# Patient Record
Sex: Female | Born: 1985
Health system: Southern US, Community
[De-identification: ages and names within clinical notes are randomized; demographics above are authoritative.]

## PROBLEM LIST (undated history)

## (undated) ENCOUNTER — Inpatient Hospital Stay (HOSPITAL_COMMUNITY): Payer: Self-pay

## (undated) DIAGNOSIS — D649 Anemia, unspecified: Secondary | ICD-10-CM

## (undated) DIAGNOSIS — J45909 Unspecified asthma, uncomplicated: Secondary | ICD-10-CM

## (undated) HISTORY — PX: NO PAST SURGERIES: SHX2092

---
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
## 2007-07-19 ENCOUNTER — Encounter (INDEPENDENT_AMBULATORY_CARE_PROVIDER_SITE_OTHER): Payer: Self-pay | Admitting: Family Medicine

## 2007-07-29 ENCOUNTER — Encounter (INDEPENDENT_AMBULATORY_CARE_PROVIDER_SITE_OTHER): Payer: Self-pay | Admitting: Family Medicine

## 2007-07-29 ENCOUNTER — Ambulatory Visit: Payer: Self-pay | Admitting: Family Medicine

## 2007-07-29 DIAGNOSIS — N920 Excessive and frequent menstruation with regular cycle: Secondary | ICD-10-CM | POA: Insufficient documentation

## 2007-07-29 LAB — CONVERTED CEMR LAB
ALT: 26 units/L (ref 0–35)
AST: 20 units/L (ref 0–37)
Albumin: 4.2 g/dL (ref 3.5–5.2)
Alkaline Phosphatase: 43 units/L (ref 39–117)
BUN: 13 mg/dL (ref 6–23)
Beta hcg, urine, semiquantitative: NEGATIVE
CO2: 23 meq/L (ref 19–32)
Calcium: 9 mg/dL (ref 8.4–10.5)
Chloride: 106 meq/L (ref 96–112)
Cholesterol: 175 mg/dL (ref 0–200)
Creatinine, Ser: 0.61 mg/dL (ref 0.40–1.20)
Glucose, Bld: 89 mg/dL (ref 70–99)
HCT: 36.6 % (ref 36.0–46.0)
HDL: 57 mg/dL (ref >39)
Hemoglobin: 11.9 g/dL — ABNORMAL LOW (ref 12.0–15.0)
LDL Cholesterol: 105 mg/dL — ABNORMAL HIGH (ref 0–99)
MCHC: 32.5 g/dL (ref 30.0–36.0)
MCV: 91.3 fL (ref 78.0–100.0)
Platelets: 211 10*3/uL (ref 150–400)
Potassium: 4.2 meq/L (ref 3.5–5.3)
RBC: 4.01 M/uL (ref 3.87–5.11)
RDW: 13.3 % (ref 11.5–15.5)
Sodium: 138 meq/L (ref 135–145)
Total Bilirubin: 0.5 mg/dL (ref 0.3–1.2)
Total CHOL/HDL Ratio: 3.1
Total Protein: 7 g/dL (ref 6.0–8.3)
Triglycerides: 67 mg/dL (ref <150)
VLDL: 13 mg/dL (ref 0–40)
WBC: 5.5 10*3/uL (ref 4.0–10.5)

## 2007-09-17 ENCOUNTER — Encounter: Payer: Self-pay | Admitting: *Deleted

## 2007-09-18 ENCOUNTER — Encounter: Payer: Self-pay | Admitting: Family Medicine

## 2007-09-19 ENCOUNTER — Ambulatory Visit: Payer: Self-pay | Admitting: Family Medicine

## 2008-01-30 ENCOUNTER — Ambulatory Visit: Payer: Self-pay | Admitting: Family Medicine

## 2008-09-28 ENCOUNTER — Ambulatory Visit: Payer: Self-pay | Admitting: Family Medicine

## 2008-09-28 ENCOUNTER — Encounter: Payer: Self-pay | Admitting: Family Medicine

## 2008-09-28 DIAGNOSIS — Z9189 Other specified personal risk factors, not elsewhere classified: Secondary | ICD-10-CM | POA: Insufficient documentation

## 2008-09-30 ENCOUNTER — Ambulatory Visit: Payer: Self-pay | Admitting: Family Medicine

## 2009-03-24 ENCOUNTER — Encounter: Payer: Self-pay | Admitting: Family Medicine

## 2009-03-24 ENCOUNTER — Ambulatory Visit: Payer: Self-pay | Admitting: Family Medicine

## 2009-03-24 ENCOUNTER — Telehealth: Payer: Self-pay | Admitting: Family Medicine

## 2009-03-24 DIAGNOSIS — R3589 Other polyuria: Secondary | ICD-10-CM | POA: Insufficient documentation

## 2009-03-24 DIAGNOSIS — R110 Nausea: Secondary | ICD-10-CM | POA: Insufficient documentation

## 2009-03-24 DIAGNOSIS — R358 Other polyuria: Secondary | ICD-10-CM | POA: Insufficient documentation

## 2009-03-24 DIAGNOSIS — B373 Candidiasis of vulva and vagina: Secondary | ICD-10-CM | POA: Insufficient documentation

## 2009-03-24 LAB — CONVERTED CEMR LAB
Beta hcg, urine, semiquantitative: NEGATIVE
Bilirubin Urine: NEGATIVE
Chlamydia, DNA Probe: NEGATIVE
GC Probe Amp, Genital: NEGATIVE
Glucose, Urine, Semiquant: NEGATIVE
Ketones, urine, test strip: NEGATIVE
Nitrite: NEGATIVE
Pap Smear: NEGATIVE
Protein, U semiquant: NEGATIVE
Specific Gravity, Urine: >=1.03
Urobilinogen, UA: 0.2
WBC Urine, dipstick: NEGATIVE
pH: 6

## 2009-06-02 ENCOUNTER — Telehealth: Payer: Self-pay | Admitting: *Deleted

## 2009-10-27 ENCOUNTER — Encounter: Payer: Self-pay | Admitting: Family Medicine

## 2010-02-08 NOTE — Miscellaneous (Signed)
Summary: ROI  ROI   Imported By: Bradly Bienenstock 07/19/2007 12:58:33  _____________________________________________________________________  External Attachment:    Type:   Image     Comment:   External Document

## 2010-02-08 NOTE — Progress Notes (Signed)
Summary: [place] request  Phone Note Call from Patient Call back at [phone]   Summary of Call: pt is wanting to be seen tomorrow for a cold Initial call taken by: Haydee Salter,  April 12, 2006 10:52 AM  Follow-up for Phone Call        c/o head cold since monday. explained viral illnesses & otc used to treat symptoms.  Needs refill on ocp. she will call pharmacy Follow-up by: Golden Circle RN,  April 12, 2006 11:04 AM

## 2010-02-08 NOTE — Assessment & Plan Note (Signed)
Summary: vision & hearing.form in triage  Nurse Visit   Vital Signs:  Patient Profile:   25 Years Old Female              Vision Screening: Left eye w/o correction: 20 / 160 Right Eye w/o correction: 20 / 160 Left eye with correction: 20 / 25 Right eye with correction: 20 / 30        Vision Entered By: Jone Baseman CMA (September 19, 2007 11:17 AM) Audiometry Screening     20db HL: Yes      Hearing Testing Entered By: Jone Baseman CMA (September 19, 2007 11:17 AM)   Prior Medications: PROAIR HFA 108 (90 BASE) MCG/ACT  AERS (ALBUTEROL SULFATE) 2 puffs inhaled every 4 hours as needed - dispense 1 device BENZACLIN WITH PUMP 1-5 % GEL (CLINDAMYCIN PHOS-BENZOYL PEROX) Apply as directed to skin twice a day FLOVENT HFA 44 MCG/ACT AERO (FLUTICASONE PROPIONATE  HFA) Inhale 2 puff twice a day ORTHO TRI-CYCLEN LO 0.025 MG TABS (NORGESTIMATE-ETHINYL ESTRADIOL) Take 1 tablet by mouth once a day PEAK FLOW METER   DEVI (PEAK FLOW METER) Use as directed - dispense 1 device TRETINOIN 0.025 %  CREA (TRETINOIN) Apply to face at bedtime - wait 30 min after washing - dispense 1 tube Current Allergies: No known allergies     Orders Added: 1)  Hearing- [facility] [92551] 2)  Vision- [facility] [[id number]] 3)  No Charge Patient Arrived (NCPA0) [NCPA0]    ]  Vital Signs:  Patient Profile:   25 Years Old Female              Vision Screening: Left eye w/o correction: 20 / 160 Right Eye w/o correction: 20 / 160 Left eye with correction: 20 / 25 Right eye with correction: 20 / 30

## 2010-02-08 NOTE — Assessment & Plan Note (Signed)
Summary: CPE/KH   Vital Signs:  Patient Profile:   25 Years Old Female Height:     65.5 inches Weight:      147.5 pounds Temp:     98.2 degrees F Pulse rate:   75 / minute BP sitting:   109 / 68  (left arm)  Pt. in pain?   no  Vitals Entered By: Alphia Kava (July 29, 2007 9:40 AM)              Is Patient Diabetic? No     PCP:  Drue Dun MD  Chief Complaint:  Asthma and Acne.  History of Present Illness: 25 yo AAF who presents for physical and to discuss Asthma, Acne.  1.  CPE - Currently on menstrual cycle.  Has not yet had pap as not sexually active until 25 yo.  Needs pap by next January.  Continues on OCP's for contraception without problems - requests refill.    2.  Asthma - Reports increased symptoms; however, not using Flovent regularly.  Also reports that Albuterol inhaler is not working as well as it used to since switching to Proventil.  Requests alternative brand.  3.  Acne - Mild comedonal acne.  Benzaclin not adequately controlling.  Requests alternative.      Prior Medication List:  PROVENTIL HFA 108 (90 BASE) MCG/ACT  AERS (ALBUTEROL SULFATE) 2 puffs inhaled q4 hrs as needed for shortness of breath or wheezing BENZACLIN WITH PUMP 1-5 % GEL (CLINDAMYCIN PHOS-BENZOYL PEROX) Apply as directed to skin twice a day FLOVENT HFA 44 MCG/ACT AERO (FLUTICASONE PROPIONATE  HFA) Inhale 2 puff twice a day ORTHO TRI-CYCLEN LO 0.025 MG TABS (NORGESTIMATE-ETHINYL ESTRADIOL) Take 1 tablet by mouth once a day   Current Allergies: No known allergies   Past Medical History:    Reviewed history from 06/18/2006 and no changes required:       s/p referral to Neuro for vertigo/dizziness       Asthma  Past Surgical History:    Reviewed history from 06/18/2006 and no changes required:       None   Family History:    Reviewed history from 06/18/2006 and no changes required:       brother - asthma       mother - asthma       sister - asthma       sister -  eczema  Social History:    Reviewed history from 06/18/2006 and no changes required:       Lives w/ mother, 2 sisters Barkley Bruns and [place]), and brother Jari Favre during summer.  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  No tob in home.  No etoh or drugs.  First sexually active at 25 yo - needs first pap at 25 yo.    Review of Systems      See HPI   Physical Exam  General:     Well-developed, well-nourished, no acute distress.  Alert and oriented x 3.  Head:     Normocephalic, atraumatic, no abnormalities noted.  Eyes:     Pupils equal round and reactive to light, conjunctivae and lids clear.  No scleral icterus.  Ears:     No external deformities.  B TM pearly gray with cone.    Nose:     No external deformities.  Nares without discharge.  Mouth:     Moist mucous membranes.  No erythema or exudate.  Neck:  Supple, non-tender, no thyromegaly, no anterior or posterior cervical, submandibular, occipital, or supraclavicular lymphadenopathy.   Breasts:     No axillary lymphadenopathy.  Skin/areolae normal.  No nipple discharge or dimpling.  Breast tissue symmetrical without masses or tenderness.  Lungs:     Clear to auscultation bilaterally.  Normal work of breathing.  No wheezes, rales, or rhonchi.  Heart:     Regular rate and rhythm.  No murmur, rub, or gallop.  2+ Dorsalis Pedis pulses.  Abdomen:     Normoactive bowel sounds.  Soft, non-tender, non-distended.  Msk:     No deformity or scoliosis noted of thoracic or lumbar spine.   Extremities:     No clubbing, cyanosis, or edema.  Neurologic:     alert & oriented X3, cranial nerves II-XII intact, strength normal in all extremities, sensation intact to light touch, gait normal, and DTRs symmetrical and normal.   Skin:     Intact without suspicious lesions or rashes Psych:     Cognition and judgment appear intact. Alert and cooperative with normal attention span  and concentration. No apparent delusions, illusions, hallucinations    Impression & Recommendations:  Problem # 1:  HEALTH MAINTENANCE EXAM (ICD-V70.0) Assessment: Unchanged Doing well.  Needs pap by 1/09.  Patient to schedule when not on menses.  Will do STD testing at that time. Orders: Comp Met-[facility] [id number]) Lipid-[facility] ([id number]) [facility] - Est  18-39 yrs (84132)   Problem # 2:  ASTHMA, UNSPECIFIED (ICD-493.90) Assessment: Deteriorated Increased symptoms per patient;however, not using Flovent regularly.  Reminded of importance as a maintenance medication even when not having symptoms.  Will change Albuterol HFA to Proair from Proventil.  Also instructed on peak flows.  Prescribed peak flow meter.  Advised to record Peak flows when feeling like symptoms are worse. Her updated medication list for this problem includes:    Proair Hfa 108 (90 Base) Mcg/act Aers (Albuterol sulfate) .Marland Kitchen... 2 puffs inhaled every 4 hours as needed - dispense 1 device    Flovent Hfa 44 Mcg/act Aero (Fluticasone propionate  hfa) ..... Inhale 2 puff twice a day Orders: [facility] - Est  18-39 yrs ([id number])   Problem # 3:  ACNE (ICD-706.1) Assessment: Unchanged Persists on Benzaclin, thus will add Tretinoin Cream at lowest strength.  Advised that this medicine should not be used if pregnant, thus needs to remain on birth control while using.  May need higher strength, but will start with lowest to avoid irritation.  Instructed on use prior to bed 30 min after washing face to help reduce irritation - may use every other day at first as well. Her updated medication list for this problem includes:    Benzaclin With Pump 1-5 % Gel (Clindamycin phos-benzoyl perox) .Marland Kitchen... Apply as directed to skin twice a day    Tretinoin 0.025 % Crea (Tretinoin) .Marland Kitchen... Apply to face at bedtime - wait 30 min after washing - dispense 1 tube rders: [facility] - Est  18-39 yrs ([id number])   Complete Medication List: 1)  Proair Hfa 108 (90 Base) Mcg/act  Aers (Albuterol sulfate) .... 2 puffs inhaled every 4 hours as needed - dispense 1 device 2)  Benzaclin With Pump 1-5 % Gel (Clindamycin phos-benzoyl perox) .... Apply as directed to skin twice a day 3)  Flovent Hfa 44 Mcg/act Aero (Fluticasone propionate  hfa) .... Inhale 2 puff twice a day 4)  Ortho Tri-cyclen Lo 0.025 Mg Tabs (Norgestimate-ethinyl estradiol) .... Take 1 tablet by mouth once a day 5)  Peak Flow Meter Devi (Peak flow meter) .... Use as directed - dispense 1 device 6)  Tretinoin 0.025 % Crea (Tretinoin) .... Apply to face at bedtime - wait 30 min after washing - dispense 1 tube  Other Orders: U Preg-[facility] ([id number]) CBC-[facility] (82956)   Patient Instructions: 1)  Please schedule a follow-up appointment for your Pap smear sometime between now and January. 2)  Use your Flovent 2 puffs two times a day consistently - if your asthma symptoms remain worse, call and we can consider increasing your dose.   3)  Your target peak flow is 475.  If you are remaining significantly lower, also call and let me know. 4)  If you could be exposed to sexually transmitted diseases, you should use a condom. 5)  If you are having sex and you or your partner don't want a child, use contraception. 6)  If you could become pregnant, take a multivitamin with folic acid every day.   Prescriptions: TRETINOIN 0.025 %  CREA (TRETINOIN) Apply to face at bedtime - wait 30 min after washing - dispense 1 tube  #1 x 3   Entered and Authorized by:   Drue Dun MD   Signed by:   Drue Dun MD on 07/29/2007   Method used:   Electronically sent to ...       [facility]  [address] [phone]*       [address]       [place], [place]  [address]       Ph: [phone]       Fax: [phone]   RxID:   [id number] FLOVENT HFA 44 MCG/ACT AERO (FLUTICASONE PROPIONATE  HFA) Inhale 2 puff twice a day  #1 x 12   Entered and Authorized by:   Drue Dun MD   Signed by:   Drue Dun MD on 07/29/2007   Method used:    Electronically sent to ...       [facility]  [address] [phone]*       [address]       [place], [place]  [address]       Ph: [phone]       Fax: [phone]   RxID:   [id number] ORTHO TRI-CYCLEN LO 0.025 MG TABS (NORGESTIMATE-ETHINYL ESTRADIOL) Take 1 tablet by mouth once a day  #90 x 3   Entered and Authorized by:   Drue Dun MD   Signed by:   Drue Dun MD on 07/29/2007   Method used:   Electronically sent to ...       [facility]  [address] [phone]*       [address]       [place], [place]  [address]       Ph: [phone]       Fax: [phone]   RxID:   [id number] PEAK FLOW METER   DEVI (PEAK FLOW METER) Use as directed - dispense 1 device  #1 x 0   Entered and Authorized by:   Drue Dun MD   Signed by:   Drue Dun MD on 07/29/2007   Method used:   Electronically sent to ...       [facility]  [address] [phone]*       [address]       [place], [place]  [address]       Ph: [phone]       Fax: [phone]   RxID:   [id number] PROAIR HFA 108 (90 BASE) MCG/ACT  AERS (ALBUTEROL SULFATE) 2 puffs inhaled every 4 hours as needed - dispense 1 device  #2 x 1   Entered and Authorized by:   Drue Dun MD   Signed by:   Drue Dun MD on 07/29/2007   Method used:   Electronically sent to ...       [address]  [facility] #[id number]*       [address]       [place], [place]  [address]       Ph: [phone]       Fax: [phone]   RxID:   [id number]  ]   Laboratory Results   Urine Tests  Date/Time Received: July 29, 2007 10:16 AM  Date/Time Reported: July 29, 2007 10:26 AM     Urine HCG: negative Comments: ...............test performed by......Marland KitchenBonnie A. [place], [place] (ASCP)

## 2010-02-08 NOTE — Miscellaneous (Signed)
Summary: School Physical Form  Mother dropped off form to be filled out for school.  Please call her when it is ready to be picked up. KATHY HARRELSON  September 17, 2007 3:46 PM  the form is for the student to fill out. there is nothing for the md to complete. I left her a message to call back. tell her to come get the form. it is at the front desk.. I did enclose copies of her shot record. [facility] Korri Ask RN  September 17, 2007 4:01 PM

## 2010-02-08 NOTE — Assessment & Plan Note (Signed)
Summary: F/U ASTHMA/REFILL MEDS [facility]     Vital Signs:  Patient Profile:   25 Years Old Female Height:     65.5 inches Weight:      139 pounds BMI:     22.86 Temp:     98.8 degrees F Pulse rate:   68 / minute BP sitting:   109 / 72  Pt. in pain?   no  Vitals Entered By: Dennison Nancy RN (June 18, 2006 8:49 AM)                PCP:  Drue Dun MD  Chief Complaint:  f/u Asthma and Birth Control refill.  History of Present Illness: 25 yo AAF who presents for f/u and refills of her Asthma and Birth Control meds.    1.  Birth Control - patient has been on Ortho-Tricyclen Lo since April 2007 without problems.  Reports regular periods which last  ~ 5 days and are medium to light in flow.  Patient does not have much difficulty remembering to take her pills - she sometimes takes them a few hours late, but has never missed more than 1 day and understands how to make up missed pills correctly.  She also uses condoms with each sexual encouter with her boyfriend.  First became sexually active at age 26 yo.   Denies vaginal discharge, abdominal pain, fever, chills, nausea, or vomiting.    2.  Asthma - patient currently on Flovent 44 micrograms - 2 puffs two times a day and Albuterol MDI as needed.  She has been taking her Flovent only occasionally as she was not aware that this is a maintenance medication.  She is requirng her Albuterol only  ~ 2 times per week at night.  Denies significant shortness of breath or coughing fits at night.     Allergies: No known drug allergies (NKDA)  Medications:  Albuterol (Aerosol 90 mcg/Actuation) 2 puff using inhaler every four hours as needed  Flovent HFA (Aerosol 44 mcg/Actuation) 2 puff twice a day Use daily even if you are not having asthma symptoms  Ortho Tri-Cyclen Lo (Tablet .18/.215/.25-25 mg-mcg) 1 tablet by mouth once a day  Past Medical History:    s/p referral to Neuro for vertigo/dizziness    Asthma  Past Surgical History:     None   Family History:    brother - asthma    mother - asthma    sister - asthma    sister - eczema  Social History:    Lives w/ mother, 2 sisters Barkley Bruns and [place]), and brother Jari Favre during summer.  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  No tob in home.  No etoh or drugs.  First sexually active at 25 yo - needs first pap at 25 yo.   Risk Factors:  Tobacco use:  never   Review of Systems       per HPI   Physical Exam  General:     Well-developed, well-nourished, no acute distress.  Alert and oriented x 3.  Lungs:     Clear to auscultation bilaterally.  Normal work of breathing.  No wheezes, rales, or rhonchi.  Mildly prolonged expiratory phase.  Heart:     Regular rate and rhythm.  No murmur, rub, or gallop.  2+ Dorsalis Pedis pulses.     Impression & Recommendations:  Problem # 1:  CONTRACEPTIVE SURVEILLANCE, ORAL AGENT (ICD-V25.41) Assessment: Unchanged Additional contraception options were  discussed and patient desires to remain on Ortho-Tricyclen Lo.  BP stable. Gave prescription for 3 packs with refills as needed x 1 year.   Patient refused STD testing today as she has an exam this morning - encouraged her to come back in 1-2 months for testing. Recommended continued condom use with each sexual encounter to prevent STD's. Orders: [facility]- Est Level  3 ([id number])   Problem # 2:  ASTHMA, UNSPECIFIED (ICD-493.90) Assessment: Unchanged Fairly well-controlled (except for peak flows) despite irregular use of Flovent. Discussed proper use of Flovent two times a day as a maintenance medication and Albuterol for rescue. Refilled Flovent and Albuterol via [facility]. Orders: [facility]- Est Level  3 (13086)   Problem # 3:  Preventive Health Care (ICD-V70.0) Assessment: Comment Only Needs pap smear starting at age 51.   Patient Instructions: 1)  Please schedule a follow-up appointment in 2-3 months. 2)  Take your  Flovent twice daily even if you don't have asthma symptoms.     Vital Signs:  Patient Profile:   25 Years Old Female Height:     65.5 inches Weight:      139 pounds BMI:     22.86 Temp:     98.8 degrees F Pulse rate:   68 / minute BP sitting:   109 / 72

## 2010-02-08 NOTE — Assessment & Plan Note (Signed)
Summary: READ PPD/B [facility]  Nurse Visit    Allergies: No Known Drug Allergies  PPD Results    Date of reading: 09/30/2008    Results: 0 mm    Interpretation: negative  Orders Added: 1)  No Charge Patient Arrived (NCPA0) [NCPA0]    gave patient copy of varicella titer report.  Theresia Lo RN  September 30, 2008 4:43 PM

## 2010-02-08 NOTE — Miscellaneous (Signed)
Summary: Asthma, persistent   Clinical Lists Changes  Problems: Changed problem from ASTHMA, UNSPECIFIED (ICD-493.90) to ASTHMA, PERSISTENT (ICD-493.90) 

## 2010-02-08 NOTE — Assessment & Plan Note (Signed)
Summary: TB TEST/KH  Nurse Visit    Allergies: No Known Drug Allergies  Immunizations Administered:  PPD Skin Test:    Vaccine Type: PPD    Site: left forearm    Mfr: Sanofi Pasteur    Dose: 0.1 ml    Route: ID    Given by: Theresia Lo RN    Exp. Date: 03/17/2011    Lot #: [id number]  Orders Added: 1)  Miscellaneous Lab Charge-[facility] [99999] 2)  TB Skin Test [86580] 3)  Admin 1st Vaccine [81017]

## 2010-02-08 NOTE — Assessment & Plan Note (Signed)
Summary: fill out forms,df   Vital Signs:  Patient Profile:   25 Years Old Female Height:     65.5 inches Weight:      156.0 pounds Temp:     97.0 degrees F oral Pulse rate:   78 / minute BP sitting:   114 / 79  (left arm)  Vitals Entered By: Alphia Kava (January 30, 2008 4:16 PM)             Is Patient Diabetic? 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  1.  Acne - previously prescribed lowest strength Tretinoin.  Pt reports it was too strong and caused lots of peeling and irritation to her face.  Would like to try something different that might still help even skin tone (has lots of post-inflammatory hyperpigmentation).  2.  Nursing School Forms - will use previous physical from 7/09; however, needs hearing and vision today.      Prior Medications Reviewed Using: Patient Recall  Current Allergies: No known allergies   Past Medical History:    Reviewed history from 06/18/2006 and no changes required:       s/p referral to Neuro for vertigo/dizziness       Asthma  Past Surgical History:    Reviewed history from 06/18/2006 and no changes required:       None   Family History:    Reviewed history from 06/18/2006 and no changes required:       brother - asthma       mother - asthma       sister - asthma, acne       sister - eczema, acne  Social History:    Reviewed history from 06/18/2006 and no changes required:       Lives w/ mother, 2 sisters Barkley Bruns and [place]), and brother Jari Favre during summer.  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  No tob in home.  No etoh or drugs.  First sexually active at 25 yo - needs first pap at 25 yo.    Review of Systems      See HPI   Physical Exam  General:     Well-developed,  well-nourished, no acute distress.  Alert and oriented x 3.  Skin:     Mild comedonal acne on cheeks bilaterally, but predominantly post-inflammatory hyperpigmentation.    Impression & Recommendations:  Problem # 1:  ACNE (ICD-706.1) Assessment: Unchanged Advised patient that post-inflammatory hyperpigmenation is difficult to treat and there is no quick fix.  Will try Differin Cream instead of Tretinoin as it is gentler, but advised still may be too strong.  Reminded to ensure face is completely dry and to start out using only every other day.  Also advised may try gentle OTC exfoliator. The following medications were removed from the medication list:    Tretinoin 0.025 % Crea (Tretinoin) .Marland Kitchen... Apply to face at bedtime - wait 30 min after washing - dispense 1 tube Her updated medication list for this problem includes:    Benzaclin With Pump 1-5 % Gel (Clindamycin phos-benzoyl perox) .Marland Kitchen... Apply as directed to skin twice a day    Differin 0.1 % Crea (Adapalene) .Marland Kitchen... Apply to dry face at bedtime -  disp 1 month supply or 1 jar Orders: [facility]- Est Level  3 (99213)   Complete Medication List: 1)  Proair Hfa 108 (90 Base) Mcg/act Aers (Albuterol sulfate) .... 2 puffs inhaled every 4 hours as needed - dispense 1 device 2)  Benzaclin With Pump 1-5 % Gel (Clindamycin phos-benzoyl perox) .... Apply as directed to skin twice a day 3)  Flovent Hfa 44 Mcg/act Aero (Fluticasone propionate  hfa) .... Inhale 2 puff twice a day 4)  Ortho Tri-cyclen Lo 0.025 Mg Tabs (Norgestimate-ethinyl estradiol) .... Take 1 tablet by mouth once a day 5)  Peak Flow Meter Devi (Peak flow meter) .... Use as directed - dispense 1 device 6)  Differin 0.1 % Crea (Adapalene) .... Apply to dry face at bedtime - disp 1 month supply or 1 jar    Prescriptions: DIFFERIN 0.1 % CREA (ADAPALENE) Apply to dry face at bedtime - disp 1 month supply or 1 jar  #1 x 1   Entered and Authorized by:   Drue Dun MD   Signed by:   Drue Dun  MD on 01/30/2008   Method used:   Electronically to        [facility] [address] [phone]* (retail)       [address]       [place], [place]  [address]       Ph: [phone]       Fax: [phone]   RxID:   [id number]

## 2010-02-08 NOTE — Progress Notes (Signed)
Summary: triage  Phone Note Call from Patient Call back at [phone]   Caller: Patient Summary of Call: throwing up yesterday and today feels lightheaded and Headache  Initial call taken by: De Nurse,  Jun 02, 2009 12:22 PM  Follow-up for Phone Call        has not taken any thing for HA. does not have anything at home. we have no appts left. sent to UC. she agreed Follow-up by: Golden Circle RN,  Jun 02, 2009 12:36 PM

## 2010-02-08 NOTE — Assessment & Plan Note (Signed)
Summary: freq urination/[employer] [facility]   Vital Signs:  Patient profile:   25 year old female Height:      65.5 inches Weight:      161.1 pounds BMI:     26.50 Temp:     97.4 degrees F oral Pulse rate:   67 / minute BP sitting:   125 / 76  (right arm) Cuff size:   regular  Vitals Entered By: Garen Grams LPN (March 24, 2009 4:16 PM) CC: frequent urination with nausea Is Patient Diabetic? No Pain Assessment Patient in pain? no        Primary Care Provider:  Delbert Harness MD  CC:  frequent urination with nausea.  History of Present Illness: 25 yo female here with increase urination frequency.  Pt states she has noticed it for maybe as long as the last 3 months but it seemed to increase significantly in the  last two days.  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  Pt denies any pain or any vaginal discharge.  Pt is sexually active, always uses condoms not on any OCP.  Pt took home prgnancy test and it was negative  Pt also states she has some nausea intermittant seems worse if she does not eat all day otherwise can happen sometime intermittantly without a trigger.  Pt has not tried anything for it.  Doesn't last long but makes it uncomfortable to sit in class or study.  Pt denies any new stress or illness.  Pt has tried phenergen but it made her tired.    Pt has not had a pap smear in over a year  Pt needs refill of her asthma meds.  Only need albuterol if she has a cold.  Otherwise very well controlled  Habits & Providers  Alcohol-Tobacco-Diet     Tobacco Status: never  Current Medications (verified): 1)  Proair Hfa 108 (90 Base) Mcg/act  Aers (Albuterol Sulfate) .... 2 Puffs Inhaled Every 4 Hours As Needed - Dispense 1 Device 2)  Benzaclin With Pump 1-5 % Gel (Clindamycin Phos-Benzoyl Perox) .... Apply As Directed To Skin Twice A Day 3)  Flovent Hfa 44 Mcg/act Aero (Fluticasone Propionate  Hfa) .... Inhale 2 Puff Twice A Day 4)   Ortho Tri-Cyclen Lo 0.025 Mg Tabs (Norgestimate-Ethinyl Estradiol) .... Take 1 Tablet By Mouth Once A Day 5)  Peak Flow Meter   Devi (Peak Flow Meter) .... Use As Directed - Dispense 1 Device 6)  Differin 0.1 % Crea (Adapalene) .... Apply To Dry Face At Bedtime - Disp 1 Month Supply or 1 Jar 7)  Zofran 4 Mg Tabs (Ondansetron Hcl) .... Take 1 Tab By Mouth Q 8 Hours As Needed 8)  Diflucan 100 Mg Tabs (Fluconazole) .... Take 1 Tab Now and Then Again in 4 Days  Allergies (verified): No Known Drug Allergies  Past History:  Past medical, surgical, family and social histories (including risk factors) reviewed, and no changes noted (except as noted below).  Past Medical History: Reviewed history from 06/18/2006 and no changes required. s/p referral to Neuro for vertigo/dizziness Asthma  Past Surgical History: Reviewed history from 06/18/2006 and no changes required. None  Family History: Reviewed history from 01/30/2008 and no changes required. brother - asthma mother - asthma sister - asthma, acne sister - eczema, acne  Social History: Reviewed history from 06/18/2006 and no changes required. Lives w/ mother, 2 sisters Barkley Bruns and Morrie Sheldon), and brother Jari Favre during summer.  In pre-nursing program  at [employer] during school year (started fall 2007) - lives in dorm.  No tob in home.  No etoh or drugs.  First sexually active at 25 yo - needs first pap at 25 yo.  Review of Systems       denis f/c abdominal pain, sob  Physical Exam  General:  Well-developed, well-nourished, no acute distress.  Alert and oriented x 3.  Eyes:  Pupils equal round and reactive to light, conjunctivae and lids clear.  No scleral icterus.  Mouth:  Moist mucous membranes.  No erythema or exudate.  Lungs:  Clear to auscultation bilaterally.  Normal work of breathing.  No wheezes, rales, or rhonchi.  Heart:  Regular rate and rhythm.  No murmur, rub, or gallop.  2+ Dorsalis Pedis pulses.  Abdomen:   Normoactive bowel sounds.  Soft, non-tender, non-distended.  Genitalia:  mild inflammation of vaginal vault, cervix looked normal appering.  Some white discharge, likely candida.   Pap taken no bleeding from cervix, no discharge from cerv ix   Impression & Recommendations:  Problem # 1:  POLYURIA (UEA-540.98) If not better than return in 1 week.  Glucose 77 today, UA normal Orders: Urinalysis-[facility] (00000) U Preg-[facility] (81025) Glucose Cap-[facility] (11914) [facility]- Est  Level 4 (99214)  Problem # 2:  CANDIDIASIS OF VULVA AND VAGINA (ICD-112.1)  Her updated medication list for this problem includes:    Diflucan 100 Mg Tabs (Fluconazole) .Marland Kitchen... Take 1 tab now and then again in 4 days  Problem # 3:  HEALTH MAINTENANCE EXAM (ICD-V70.0) did pap today  Problem # 4:  ASTHMA, UNSPECIFIED (ICD-493.90)  Her updated medication list for this problem includes:    Proair Hfa 108 (90 Base) Mcg/act Aers (Albuterol sulfate) .Marland Kitchen... 2 puffs inhaled every 4 hours as needed - dispense 1 device    Flovent Hfa 44 Mcg/act Aero (Fluticasone propionate  hfa) ..... Inhale 2 puff twice a day  Orders: [facility]- Est  Level 4 (78295)  Problem # 5:  NAUSEA ALONE (ICD-787.02) will try zofran, could be ulcer, likely duodenal becasue it worsens without food.  consider ppi if not better Her updated medication list for this problem includes:    Zofran 4 Mg Tabs (Ondansetron hcl) .Marland Kitchen... Take 1 tab by mouth q 8 hours as needed  Orders: [facility]- Est  Level 4 (99214)  Complete Medication List: 1)  Proair Hfa 108 (90 Base) Mcg/act Aers (Albuterol sulfate) .... 2 puffs inhaled every 4 hours as needed - dispense 1 device 2)  Benzaclin With Pump 1-5 % Gel (Clindamycin phos-benzoyl perox) .... Apply as directed to skin twice a day 3)  Flovent Hfa 44 Mcg/act Aero (Fluticasone propionate  hfa) .... Inhale 2 puff twice a day 4)  Ortho Tri-cyclen Lo 0.025 Mg Tabs (Norgestimate-ethinyl estradiol) .... Take 1 tablet by mouth once a day 5)  Peak  Flow Meter Devi (Peak flow meter) .... Use as directed - dispense 1 device 6)  Differin 0.1 % Crea (Adapalene) .... Apply to dry face at bedtime - disp 1 month supply or 1 jar 7)  Zofran 4 Mg Tabs (Ondansetron hcl) .... Take 1 tab by mouth q 8 hours as needed 8)  Diflucan 100 Mg Tabs (Fluconazole) .... Take 1 tab now and then again in 4 days  Other Orders: GC/Chlamydia-[facility] (87591/87491) Pap Smear-[facility] (62130-86578)  Patient Instructions: 1)  Very nice to meet you 2)  I am giving you numerous prescriptions 3)  I refilled your flovent and albuterol 4)  I am  giving you a medicine for your nausea zofran- can take it up to three times daily 5)  It looks like you have a yeast infection.  Will give you diflucan.  Take 1 pill today and then again in four days.   6)  If you continue to have problems please come back in 1 week Prescriptions: DIFLUCAN 100 MG TABS (FLUCONAZOLE) take 1 tab now and then again in 4 days  #2 x 1   Entered and Authorized by:   Antoine Primas DO   Signed by:   Antoine Primas DO on 03/24/2009   Method used:   Electronically to        [facility] [phone]* (retail)       [address]       [place], [place]  [address]       Ph: [phone]       Fax: [phone]   RxID:   [id number] ZOFRAN 4 MG TABS (ONDANSETRON HCL) Take 1 tab by mouth q 8 hours as needed  #30 x 1   Entered and Authorized by:   Antoine Primas DO   Signed by:   Antoine Primas DO on 03/24/2009   Method used:   Electronically to        [facility] [phone]* (retail)       [address]       [place], [place]  [address]       Ph: [phone]       Fax: [phone]   RxID:   [id number] FLOVENT HFA 44 MCG/ACT AERO (FLUTICASONE PROPIONATE  HFA) Inhale 2 puff twice a day  #1 x 12   Entered and Authorized by:   Antoine Primas DO   Signed by:   Antoine Primas DO on 03/24/2009   Method used:   Electronically to        [facility] [phone]* (retail)       [address]        [place], [place]  [address]       Ph: [phone]       Fax: [phone]   RxID:   [id number] PROAIR HFA 108 (90 BASE) MCG/ACT  AERS (ALBUTEROL SULFATE) 2 puffs inhaled every 4 hours as needed - dispense 1 device  #2 x 2   Entered and Authorized by:   Antoine Primas DO   Signed by:   Antoine Primas DO on 03/24/2009   Method used:   Electronically to        [facility] [phone]* (retail)       [address]       [place], [place]  [address]       Ph: [phone]       Fax: [phone]   RxID:   [id number]   Laboratory Results   Urine Tests  Date/Time Received: March 24, 2009 4:26 PM  Date/Time Reported: March 24, 2009 4:42 PM   Routine Urinalysis   Color: yellow Appearance: Clear Glucose: negative   (Normal Range: Negative) Bilirubin: negative   (Normal Range: Negative) Ketone: negative   (Normal Range: Negative) Spec. Gravity: >=1.030   (Normal Range: 1.003-1.035) Blood: trace-intact   (Normal Range: Negative) pH: 6.0   (Normal Range: 5.0-8.0) Protein: negative   (Normal Range: Negative) Urobilinogen: 0.2   (Normal Range: 0-1) Nitrite: negative   (Normal Range: Negative) Leukocyte Esterace: negative   (Normal Range: Negative)  Urine Microscopic RBC/HPF: 1-5 Bacteria/HPF: 2+ Mucous/HPF: 2+ Epithelial/HPF:  5-15    Urine HCG: negative Comments: ...............test performed by......Marland KitchenBonnie A. [place], MLS (ASCP)cm

## 2010-02-08 NOTE — Miscellaneous (Signed)
Summary: Physical Form  Patient dropped off form to be filled for school.  Please call her when it is ready to be picked up. KATHY HARRELSON  September 18, 2007 9:56 AM form to md chart box. pt will need hearing & vision testing done when she comes to get the form. she already picked up copies of the shot record yesterday.Kennon Rounds CALDWELL RN  September 18, 2007 11:01 AM

## 2010-02-08 NOTE — Progress Notes (Signed)
Summary: wi request  Phone Note Call from Patient Call back at [phone]   Reason for Call: Talk to Nurse Summary of Call: pt requesting wi appt for some "symptoms shes been having" Initial call taken by: Knox Royalty,  March 24, 2009 12:03 PM  Follow-up for Phone Call        c/o freq urination sometimes as soon as 10 minutes after she just urinated. this has been happening for a few months. also c/o nausea. does not use birth control. just finished period. home pregnancy test was negative appt today at 4pm with Dr. Katrinka Blazing Follow-up by: Golden Circle RN,  March 24, 2009 12:18 PM

## 2010-02-14 ENCOUNTER — Ambulatory Visit (INDEPENDENT_AMBULATORY_CARE_PROVIDER_SITE_OTHER): Payer: Medicare HMO | Admitting: Family Medicine

## 2010-02-14 ENCOUNTER — Encounter: Payer: Self-pay | Admitting: Family Medicine

## 2010-02-14 DIAGNOSIS — J45909 Unspecified asthma, uncomplicated: Secondary | ICD-10-CM

## 2010-02-14 DIAGNOSIS — R110 Nausea: Secondary | ICD-10-CM

## 2010-02-24 NOTE — Assessment & Plan Note (Signed)
Summary: nausea and asthma   Vital Signs:  Patient profile:   25 year old female Height:      65.5 inches Weight:      165 pounds BMI:     27.14 Temp:     98.4 degrees F oral Pulse rate:   66 / minute Pulse rhythm:   regular BP sitting:   115 / 70  (left arm) Cuff size:   regular  Vitals Entered By: Loralee Pacas CMA (February 14, 2010 9:36 AM) CC: Nausea, Asthma Is Patient Diabetic? No Pain Assessment Patient in pain? no      Comments Nausea and Asthma   Primary Care Provider:  Delbert Harness MD  CC:  Nausea and Asthma.  History of Present Illness: Nausea: Pt had an episode of nausea and vomiting in Oct where she got some Zofran and has continued to occasionally use the medicine as needed. She occasionally feels nauseated and has headaches (? migraines) or notices that she has nausea with erratic eating patterns (she's a student and eats at different times). No symptoms of reflux.    Asthma: Has had some bad episodes over the last months but no ER visits, occasionally has a bad day and has to use Albuterol every 4 hours but is usually only using it twice a week. Not taking Flovent daily as prescribed. 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 She had a flow meter but is not using it.   Habits & Providers  Alcohol-Tobacco-Diet     Tobacco Status: never  Current Medications (verified): 1)  Proair Hfa 108 (90 Base) Mcg/act  Aers (Albuterol Sulfate) .... 2 Puffs Inhaled Every 4 Hours As Needed - Dispense 1 Device 2)  Flovent Hfa 44 Mcg/act Aero (Fluticasone Propionate  Hfa) .... Inhale 2 Puff Twice A Day 3)  Peak Flow Meter   Devi (Peak Flow Meter) .... Use As Directed - Dispense 1 Device 4)  Zofran 4 Mg Tabs (Ondansetron Hcl) .... Take 1 Tab By Mouth Q 8 Hours As Needed  Allergies (verified): No Known Drug Allergies  Social History: Lives w/ mother, 2 sisters Barkley Bruns and [place]), and brother Jari Favre during summer.  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  No tob in home.  No etoh or drugs.  First sexually active at 25 yo  Review of Systems        vitals reviewed and pertinent negatives and positives seen in HPI   Physical Exam  General:  Well-developed,well-nourished,in no acute distress; alert,appropriate and cooperative throughout examination Lungs:  Normal respiratory effort, chest expands symmetrically. Lungs are clear to auscultation, no crackles or wheezes. Heart:  Normal rate and regular rhythm. S1 and S2 normal without gallop, murmur, click, rub or other extra sounds. Abdomen:  Bowel sounds positive,abdomen soft and non-tender without masses, organomegaly or hernias noted.   Impression & Recommendations:  Problem # 1:  NAUSEA ALONE (ICD-787.02) Assessment Deteriorated Pt still finds herself being nauseated from time to time. Sometimes associated with headaches. ? Migraines but she says she has never been diagnosed with migraines and her headaches do not sound to be debilitation. Will have her follow up with her PCP in the future for eval of her headaches if needed.   Her updated medication list for this problem includes:    Zofran 4 Mg Tabs (Ondansetron hcl) .Marland Kitchen... Take 1 tab by mouth q 8 hours as needed  Orders: [facility]- Est Level  3 (62130)  Problem # 2:  ASTHMA, PERSISTENT (ICD-493.90) Assessment: Unchanged Pt is not taking flovent as prescribed. Advised to start using it as directed. Proair refilled.   Her updated medication list for this problem includes:    Proair Hfa 108 (90 Base) Mcg/act Aers (Albuterol sulfate) .Marland Kitchen... 2 puffs inhaled every 4 hours as needed - dispense 1 device    Flovent Hfa 44 Mcg/act Aero (Fluticasone propionate  hfa) ..... Inhale 2 puff twice a day  Orders: [facility]- Est Level  3 (99213)  Complete Medication List: 1)  Proair Hfa 108 (90 Base) Mcg/act Aers (Albuterol sulfate) .... 2 puffs inhaled every 4 hours as needed - dispense 1 device 2)  Flovent Hfa 44 Mcg/act Aero  (Fluticasone propionate  hfa) .... Inhale 2 puff twice a day 3)  Peak Flow Meter Devi (Peak flow meter) .... Use as directed - dispense 1 device 4)  Zofran 4 Mg Tabs (Ondansetron hcl) .... Take 1 tab by mouth q 8 hours as needed  Patient Instructions: 1)  It was nice to meet you today.  2)  Remember to take your Flovent daily.  3)  If you need birth control or any other medications Please see Dr. Earnest Bailey.  4)  Try to have one soft stool a day by eating fiber and being active.  5)  Your next pap smear is due on 03-26-10 or after. Please call to make that appointment.  6)  Fiber handout given Prescriptions: ZOFRAN 4 MG TABS (ONDANSETRON HCL) Take 1 tab by mouth q 8 hours as needed  #30 x 0   Entered and Authorized by:   Jamie Brookes MD   Signed by:   Jamie Brookes MD on 02/14/2010   Method used:   Electronically to        [facility] [phone]* (retail)       [address]       [place], [place]  [address]       Ph: [phone]       Fax: [phone]   RxID:   [id number] PROAIR HFA 108 (90 BASE) MCG/ACT  AERS (ALBUTEROL SULFATE) 2 puffs inhaled every 4 hours as needed - dispense 1 device  #2 x 3   Entered and Authorized by:   Jamie Brookes MD   Signed by:   Jamie Brookes MD on 02/14/2010   Method used:   Electronically to        [facility] [phone]* (retail)       [address]       [place], [place]  [address]       Ph: [phone]       Fax: [phone]   RxID:   [id number]    Orders Added: 1)  [facility]- Est Level  3 [35573]

## 2010-04-03 LAB — GLUCOSE, CAPILLARY: Glucose-Capillary: 77 mg/dL (ref 70–99)

## 2010-07-05 ENCOUNTER — Telehealth: Payer: Self-pay | Admitting: Family Medicine

## 2010-07-05 NOTE — Telephone Encounter (Signed)
Patient needs a copy of the shot record and lab for the chicken pox.  She would like to pick these up tomorrow morning around 11am.

## 2010-07-06 NOTE — Telephone Encounter (Signed)
Up front to be picked up 

## 2010-07-08 ENCOUNTER — Telehealth: Payer: Self-pay | Admitting: Family Medicine

## 2010-07-08 NOTE — Telephone Encounter (Signed)
[facility] faxed over a shot record from the [employer].  She is hoping it was received because she doesn't want to have to get another shot today.

## 2010-07-08 NOTE — Telephone Encounter (Signed)
Received it yesterday and gave it to Jimmy Footman.  She will call patient.

## 2011-08-18 ENCOUNTER — Telehealth: Payer: Self-pay | Admitting: Family Medicine

## 2011-08-18 NOTE — Telephone Encounter (Signed)
Pt is needing a copy of her shot record - pls call when ready

## 2011-08-18 NOTE — Telephone Encounter (Signed)
Informed patient that shot record is up front to be picked up

## 2012-08-02 LAB — OB RESULTS CONSOLE GC/CHLAMYDIA
Chlamydia: NEGATIVE
Gonorrhea: NEGATIVE

## 2012-08-02 LAB — OB RESULTS CONSOLE ANTIBODY SCREEN: Antibody Screen: NEGATIVE

## 2012-08-02 LAB — OB RESULTS CONSOLE ABO/RH: RH Type: POSITIVE

## 2012-08-02 LAB — OB RESULTS CONSOLE RPR: RPR: NONREACTIVE

## 2012-08-02 LAB — OB RESULTS CONSOLE HEPATITIS B SURFACE ANTIGEN: Hepatitis B Surface Ag: NEGATIVE

## 2012-08-02 LAB — OB RESULTS CONSOLE RUBELLA ANTIBODY, IGM: Rubella: IMMUNE

## 2012-11-29 LAB — OB RESULTS CONSOLE HIV ANTIBODY (ROUTINE TESTING): HIV: NONREACTIVE

## 2013-02-14 ENCOUNTER — Encounter (HOSPITAL_COMMUNITY): Payer: Self-pay | Admitting: *Deleted

## 2013-02-14 ENCOUNTER — Encounter (HOSPITAL_COMMUNITY): Payer: 59 | Admitting: Anesthesiology

## 2013-02-14 ENCOUNTER — Inpatient Hospital Stay (HOSPITAL_COMMUNITY)
Admission: AD | Admit: 2013-02-14 | Discharge: 2013-02-16 | DRG: 775 | Disposition: A | Discharge status: Home / Self Care | Payer: 59 | Source: Ambulatory Visit | Attending: Obstetrics and Gynecology | Admitting: Obstetrics and Gynecology

## 2013-02-14 ENCOUNTER — Inpatient Hospital Stay (HOSPITAL_COMMUNITY): Payer: 59 | Admitting: Anesthesiology

## 2013-02-14 DIAGNOSIS — O99892 Other specified diseases and conditions complicating childbirth: Secondary | ICD-10-CM | POA: Diagnosis present

## 2013-02-14 DIAGNOSIS — O9902 Anemia complicating childbirth: Secondary | ICD-10-CM | POA: Diagnosis present

## 2013-02-14 DIAGNOSIS — O878 Other venous complications in the puerperium: Secondary | ICD-10-CM | POA: Diagnosis present

## 2013-02-14 DIAGNOSIS — J45909 Unspecified asthma, uncomplicated: Secondary | ICD-10-CM | POA: Diagnosis present

## 2013-02-14 DIAGNOSIS — D649 Anemia, unspecified: Secondary | ICD-10-CM | POA: Diagnosis present

## 2013-02-14 DIAGNOSIS — B3731 Acute candidiasis of vulva and vagina: Secondary | ICD-10-CM

## 2013-02-14 DIAGNOSIS — O9989 Other specified diseases and conditions complicating pregnancy, childbirth and the puerperium: Secondary | ICD-10-CM

## 2013-02-14 DIAGNOSIS — K645 Perianal venous thrombosis: Secondary | ICD-10-CM | POA: Diagnosis present

## 2013-02-14 DIAGNOSIS — B373 Candidiasis of vulva and vagina: Secondary | ICD-10-CM

## 2013-02-14 DIAGNOSIS — Z22330 Carrier of Group B streptococcus: Secondary | ICD-10-CM

## 2013-02-14 HISTORY — DX: Unspecified asthma, uncomplicated: J45.909

## 2013-02-14 LAB — CBC
HCT: 30.7 % — ABNORMAL LOW (ref 36.0–46.0)
Hemoglobin: 10 g/dL — ABNORMAL LOW (ref 12.0–15.0)
MCH: 27.9 pg (ref 26.0–34.0)
MCHC: 32.6 g/dL (ref 30.0–36.0)
MCV: 85.5 fL (ref 78.0–100.0)
Platelets: 189 10*3/uL (ref 150–400)
RBC: 3.59 MIL/uL — ABNORMAL LOW (ref 3.87–5.11)
RDW: 13.9 % (ref 11.5–15.5)
WBC: 8.8 10*3/uL (ref 4.0–10.5)

## 2013-02-14 LAB — RPR: RPR Ser Ql: NONREACTIVE

## 2013-02-14 LAB — OB RESULTS CONSOLE GBS: GBS: POSITIVE

## 2013-02-14 MED ORDER — PHENYLEPHRINE 40 MCG/ML (10ML) SYRINGE FOR IV PUSH (FOR BLOOD PRESSURE SUPPORT)
80.0000 ug | PREFILLED_SYRINGE | INTRAVENOUS | Status: DC | PRN
  Filled 2013-02-14: qty 10
  Filled 2013-02-14: qty 2

## 2013-02-14 MED ORDER — PENICILLIN G POTASSIUM 5000000 UNITS IJ SOLR
5.0000 10*6.[IU] | Freq: Once | INTRAVENOUS | Status: AC
  Administered 2013-02-14: 5 10*6.[IU] via INTRAVENOUS
  Filled 2013-02-14: qty 5

## 2013-02-14 MED ORDER — LACTATED RINGERS IV SOLN
INTRAVENOUS | Status: DC
  Administered 2013-02-14: 18:00:00 via INTRAVENOUS

## 2013-02-14 MED ORDER — EPHEDRINE 5 MG/ML INJ
INTRAVENOUS | Status: AC
  Filled 2013-02-14: qty 4

## 2013-02-14 MED ORDER — OXYTOCIN BOLUS FROM INFUSION: 500.0000 mL | INTRAVENOUS | Status: DC

## 2013-02-14 MED ORDER — ACETAMINOPHEN 325 MG PO TABS: 650.0000 mg | ORAL_TABLET | ORAL | Status: DC | PRN

## 2013-02-14 MED ORDER — IBUPROFEN 600 MG PO TABS
600.0000 mg | ORAL_TABLET | Freq: Four times a day (QID) | ORAL | Status: DC | PRN
  Filled 2013-02-14: qty 1

## 2013-02-14 MED ORDER — LIDOCAINE HCL (PF) 1 % IJ SOLN
30.0000 mL | INTRAMUSCULAR | Status: DC | PRN
  Filled 2013-02-14 (×2): qty 30

## 2013-02-14 MED ORDER — OXYCODONE-ACETAMINOPHEN 5-325 MG PO TABS: 1.0000 | ORAL_TABLET | ORAL | Status: DC | PRN

## 2013-02-14 MED ORDER — DIPHENHYDRAMINE HCL 50 MG/ML IJ SOLN: 12.5000 mg | INTRAMUSCULAR | Status: DC | PRN

## 2013-02-14 MED ORDER — FENTANYL CITRATE 0.05 MG/ML IJ SOLN
INTRAMUSCULAR | Status: DC | PRN
  Administered 2013-02-14 (×2): 50 ug via EPIDURAL

## 2013-02-14 MED ORDER — LACTATED RINGERS IV SOLN: 500.0000 mL | INTRAVENOUS | Status: DC | PRN

## 2013-02-14 MED ORDER — FENTANYL 2.5 MCG/ML BUPIVACAINE 1/10 % EPIDURAL INFUSION (WH - ANES)
INTRAMUSCULAR | Status: AC
  Administered 2013-02-14: 14 mL/h via EPIDURAL
  Filled 2013-02-14: qty 125

## 2013-02-14 MED ORDER — IBUPROFEN 600 MG PO TABS: 600.0000 mg | ORAL_TABLET | Freq: Four times a day (QID) | ORAL | Status: DC | PRN

## 2013-02-14 MED ORDER — EPHEDRINE 5 MG/ML INJ
10.0000 mg | INTRAVENOUS | Status: DC | PRN
  Filled 2013-02-14: qty 2

## 2013-02-14 MED ORDER — OXYTOCIN 40 UNITS IN LACTATED RINGERS INFUSION - SIMPLE MED: 62.5000 mL/h | INTRAVENOUS | Status: DC

## 2013-02-14 MED ORDER — BUPIVACAINE HCL (PF) 0.25 % IJ SOLN
INTRAMUSCULAR | Status: DC | PRN
  Administered 2013-02-14 (×2): 4 mL via EPIDURAL

## 2013-02-14 MED ORDER — PENICILLIN G POTASSIUM 5000000 UNITS IJ SOLR
2.5000 10*6.[IU] | INTRAMUSCULAR | Status: DC
  Administered 2013-02-14: 2.5 10*6.[IU] via INTRAVENOUS
  Filled 2013-02-14 (×6): qty 2.5

## 2013-02-14 MED ORDER — FENTANYL 2.5 MCG/ML BUPIVACAINE 1/10 % EPIDURAL INFUSION (WH - ANES): 14.0000 mL/h | INTRAMUSCULAR | Status: DC | PRN

## 2013-02-14 MED ORDER — LIDOCAINE HCL (PF) 1 % IJ SOLN
INTRAMUSCULAR | Status: DC | PRN
  Administered 2013-02-14 (×4): 4 mL

## 2013-02-14 MED ORDER — ONDANSETRON HCL 4 MG/2ML IJ SOLN
4.0000 mg | Freq: Four times a day (QID) | INTRAMUSCULAR | Status: DC | PRN
  Administered 2013-02-14 (×2): 4 mg via INTRAVENOUS
  Filled 2013-02-14 (×2): qty 2

## 2013-02-14 MED ORDER — FLEET ENEMA 7-19 GM/118ML RE ENEM: 1.0000 | ENEMA | RECTAL | Status: DC | PRN

## 2013-02-14 MED ORDER — PHENYLEPHRINE 40 MCG/ML (10ML) SYRINGE FOR IV PUSH (FOR BLOOD PRESSURE SUPPORT)
80.0000 ug | PREFILLED_SYRINGE | INTRAVENOUS | Status: DC | PRN
  Filled 2013-02-14: qty 2

## 2013-02-14 MED ORDER — FENTANYL CITRATE 0.05 MG/ML IJ SOLN
INTRAMUSCULAR | Status: AC
  Filled 2013-02-14: qty 2

## 2013-02-14 MED ORDER — LACTATED RINGERS IV SOLN
500.0000 mL | INTRAVENOUS | Status: DC | PRN
  Administered 2013-02-14: 500 mL via INTRAVENOUS

## 2013-02-14 MED ORDER — PHENYLEPHRINE 40 MCG/ML (10ML) SYRINGE FOR IV PUSH (FOR BLOOD PRESSURE SUPPORT)
PREFILLED_SYRINGE | INTRAVENOUS | Status: AC
  Filled 2013-02-14: qty 5

## 2013-02-14 MED ORDER — BUTORPHANOL TARTRATE 1 MG/ML IJ SOLN
1.0000 mg | Freq: Once | INTRAMUSCULAR | Status: AC
  Administered 2013-02-14: 1 mg via INTRAVENOUS

## 2013-02-14 MED ORDER — OXYTOCIN 40 UNITS IN LACTATED RINGERS INFUSION - SIMPLE MED
1.0000 m[IU]/min | INTRAVENOUS | Status: DC
Start: 2013-02-14 — End: 2013-02-15

## 2013-02-14 MED ORDER — EPHEDRINE 5 MG/ML INJ
10.0000 mg | INTRAVENOUS | Status: DC | PRN
  Filled 2013-02-14: qty 4
  Filled 2013-02-14: qty 2

## 2013-02-14 MED ORDER — CITRIC ACID-SODIUM CITRATE 334-500 MG/5ML PO SOLN
30.0000 mL | ORAL | Status: DC | PRN
  Filled 2013-02-14: qty 15

## 2013-02-14 MED ORDER — OXYTOCIN 40 UNITS IN LACTATED RINGERS INFUSION - SIMPLE MED
1.0000 m[IU]/min | INTRAVENOUS | Status: DC
  Administered 2013-02-14: 1 m[IU]/min via INTRAVENOUS
  Filled 2013-02-14: qty 1000

## 2013-02-14 MED ORDER — LIDOCAINE HCL (PF) 1 % IJ SOLN: 30.0000 mL | INTRAMUSCULAR | Status: DC | PRN

## 2013-02-14 MED ORDER — CITRIC ACID-SODIUM CITRATE 334-500 MG/5ML PO SOLN
30.0000 mL | ORAL | Status: DC | PRN
  Administered 2013-02-14: 30 mL via ORAL

## 2013-02-14 MED ORDER — ONDANSETRON HCL 4 MG/2ML IJ SOLN: 4.0000 mg | Freq: Four times a day (QID) | INTRAMUSCULAR | Status: DC | PRN

## 2013-02-14 MED ORDER — LACTATED RINGERS IV SOLN: INTRAVENOUS | Status: DC

## 2013-02-14 MED ORDER — LACTATED RINGERS IV SOLN
500.0000 mL | Freq: Once | INTRAVENOUS | Status: AC
  Administered 2013-02-14: 500 mL via INTRAVENOUS

## 2013-02-14 MED ORDER — BUTORPHANOL TARTRATE 1 MG/ML IJ SOLN
INTRAMUSCULAR | Status: AC
  Filled 2013-02-14: qty 1

## 2013-02-14 NOTE — Anesthesia Procedure Notes (Signed)
Epidural Patient location during procedure: OB Start time: 02/14/2013 5:46 PM  Staffing Performed by: anesthesiologist   Preanesthetic Checklist Completed: patient identified, site marked, surgical consent, pre-op evaluation, timeout performed, IV checked, risks and benefits discussed and monitors and equipment checked  Epidural Patient position: sitting Prep: site prepped and draped and DuraPrep Patient monitoring: continuous pulse ox and blood pressure Approach: midline Injection technique: LOR air  Needle:  Needle type: Tuohy  Needle gauge: 17 G Needle length: 9 cm and 9 Needle insertion depth: 6 cm Catheter type: closed end flexible Catheter size: 19 Gauge Catheter at skin depth: 11 cm Test dose: negative  Assessment Events: blood not aspirated, injection not painful, no injection resistance, negative IV test and no paresthesia  Additional Notes Discussed risk of headache, infection, bleeding, nerve injury and failed or incomplete block.  Patient voices understanding and wishes to proceed.  Epidural placed easily on first pass.  No paresthesia.  Patient tolerated procedure well with no apparent complications.  Jasmine DecemberA. Quintrell Baze, MDReason for block:procedure for pain

## 2013-02-14 NOTE — Progress Notes (Signed)
Patient ID: Deborah Odom, female   DOB: August 14, 1985, 28 y.o.   MRN: [id number] Delivery note:   The pt progressed to 8 cm and I came to the hospital. I was called in my room and was told the pt was 10 cm and was starting to push. I offered to come in the room but the RN assured me she would call me when she needed me. When I was called I arrived in the room in less than 30 seconds but the baby had already delivered. The cord was still attached. The female infant had Apgars of 9 and 9 at 1 and 5 minutes. I had the FOB cut the cord. Then, the placenta was delivered spontaneously and the uterus was normal. There was a small second degree laceration that was repaired with 3-0 vicryl. There was a 2 cm thin projection of tissue from the anterior cervix that was removed and the base was sutured with 2-0 vicryl. There was a small laceration of the hymen at 9 o'clock that was hemostatic and not repaired. EBL 400 cc's.

## 2013-02-14 NOTE — Anesthesia Preprocedure Evaluation (Signed)
Anesthesia Evaluation  Patient identified by MRN, date of birth, ID band Patient awake    Reviewed: Allergy & Precautions, H&P , NPO status , Patient's Chart, lab work & pertinent test results, reviewed documented beta blocker date and time   History of Anesthesia Complications Negative for: history of anesthetic complications  Airway Mallampati: III TM Distance: >3 FB Neck ROM: full    Dental  (+) Teeth Intact   Pulmonary asthma (last inhaler use 2 weeks ago) ,  breath sounds clear to auscultation        Cardiovascular negative cardio ROS  Rhythm:regular Rate:Normal     Neuro/Psych negative neurological ROS  negative psych ROS   GI/Hepatic negative GI ROS, Neg liver ROS,   Endo/Other  negative endocrine ROS  Renal/GU negative Renal ROS     Musculoskeletal   Abdominal   Peds  Hematology  (+) anemia ,   Anesthesia Other Findings   Reproductive/Obstetrics (+) Pregnancy                           Anesthesia Physical Anesthesia Plan  ASA: II  Anesthesia Plan: Epidural   Post-op Pain Management:    Induction:   Airway Management Planned:   Additional Equipment:   Intra-op Plan:   Post-operative Plan:   Informed Consent: I have reviewed the patients History and Physical, chart, labs and discussed the procedure including the risks, benefits and alternatives for the proposed anesthesia with the patient or authorized representative who has indicated his/her understanding and acceptance.     Plan Discussed with:   Anesthesia Plan Comments:         Anesthesia Quick Evaluation

## 2013-02-14 NOTE — H&P (Signed)
NAMManfred Shirts:  Odom, Deborah              ACCOUNT NO.:  [id number]  MEDICAL RECORD NO.:  [id number]  LOCATION:  9167                          FACILITY:  [facility]  PHYSICIAN:  Malachi Prohomas F. Ambrose MantleHenley, M.D. DATE OF BIRTH:  06/07/1985  DATE OF ADMISSION:  02/14/2013 DATE OF DISCHARGE:                             HISTORY & PHYSICAL   HISTORY OF PRESENT ILLNESS:  This is a 28 year old black female, para 0, gravida 1, EDC February 12, 2013 admitted for induction of labor, after a nonstress test in the office showed some early decelerations.  Blood group and type A positive, negative antibody.  Pap smear normal. Rubella immune.  RPR nonreactive.  Urine culture negative.  Hepatitis B surface antigen negative.  HIV negative.  Hemoglobin electrophoresis AA. GC and chlamydia negative.  Cystic fibrosis negative.  First trimester screen negative.  AFP was negative.  One hour Glucola was 91.  Repeat HIV and RPR negative.  GC and chlamydia negative.  Group B strep positive.  The patient began her prenatal course in our office at 13 weeks and 6 days.  An ultrasound on August 02, 2012, showed an estimated gestational age of [redacted] weeks 2 days [facility]  The patient had hemorrhoid complaints at 30 weeks and 6 days and exam showed prolapsed hemorrhoids at 12 and 4 o'clock.  The 4 o'clock hemorrhoid was thrombosed.  She, however, so was felt to measure a small of date, and underwent a non-stress test which was reactive on ultrasound for size less than date.  The ultrasound showed normal growth and normal amniotic fluid.  Estimated fetal weight in the 33rd percentile.  Her hemorrhoids improved with treatment.  Her cervix remain closed until her visit on February 12, 2013 which noted to be 2 cm, 50%, vertex at -3.  Nonstress test was reactive.  The repeat non-stress test on February 14, 2013 showed reactive non-stress test but there were decelerations with several contractions. The decelerations began and  ended after the onset and before the end of the contraction, but the decelerations were approximately 20 beats per minute.  Since the patient was already past term, I wanted her admitted for induction of labor.  PAST MEDICAL HISTORY:  She did have asthma since the 6th grade.  She has had 1 urinary tract infection.  ALLERGIES: 1. IODINE CAUSED RESPIRATORY DISTRESS. 2. SHELLFISH CAUSED RESPIRATORY DISTRESS. 3. SHE HAS NO LATEX ALLERGY.  PAST SURGICAL HISTORY:  None.  SOCIAL HISTORY:  The patient was never a smoker.  Does not drink.  No illicit drugs.  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  Her husband works in a group home.  FAMILY HISTORY:  Maternal grandmother with heart disease, high blood pressure, and diabetes.  Maternal aunt with cancer of the lung. Brother, sister, and mother with asthma.  PHYSICAL EXAMINATION:  GENERAL:  On admission, well-developed, well- nourished black female in no distress. VITAL SIGNS:  Normal. HEART:  Normal size and sounds.  No murmurs. LUNGS:  Clear to auscultation. ABDOMEN:  Fundal height compatible with term pregnancy.  Cervix 2 cm, 50%, vertex at a -3.  Artificial rupture of the membranes produced possibly  slightly meconium-stained fluid.  ADMITTING IMPRESSION:  Intrauterine pregnancy at 40 weeks and 2 days, with decelerations on a non-stress test, positive group B strep.  The patient is admitted for induction of labor and treatment for group B strep.     Malachi Pro. Ambrose Mantle, M.D.     TFH/MEDQ  D:  02/14/2013  T:  02/14/2013  Job:  [id number]

## 2013-02-14 NOTE — Progress Notes (Signed)
Patient ID: Deborah Odom, female   DOB: 1985/06/20, 28 y.o.   MRN: [id number] Pitocin is at 5 mu/ minute and the pt is having painful contractions The cervix is 3 cm 80-90 % effaced and the vertex is at - 2 station The pt requests and I have authorized an epidural

## 2013-02-15 ENCOUNTER — Encounter (HOSPITAL_COMMUNITY): Payer: Self-pay | Admitting: *Deleted

## 2013-02-15 LAB — CBC
HCT: 30.5 % — ABNORMAL LOW (ref 36.0–46.0)
Hemoglobin: 9.9 g/dL — ABNORMAL LOW (ref 12.0–15.0)
MCH: 27.7 pg (ref 26.0–34.0)
MCHC: 32.5 g/dL (ref 30.0–36.0)
MCV: 85.2 fL (ref 78.0–100.0)
Platelets: 199 10*3/uL (ref 150–400)
RBC: 3.58 MIL/uL — ABNORMAL LOW (ref 3.87–5.11)
RDW: 13.8 % (ref 11.5–15.5)
WBC: 16.4 10*3/uL — ABNORMAL HIGH (ref 4.0–10.5)

## 2013-02-15 MED ORDER — PRENATAL MULTIVITAMIN CH
1.0000 | ORAL_TABLET | Freq: Every day | ORAL | Status: DC
  Administered 2013-02-15 – 2013-02-16 (×2): 1 via ORAL
  Filled 2013-02-15 (×2): qty 1

## 2013-02-15 MED ORDER — ALBUTEROL SULFATE (2.5 MG/3ML) 0.083% IN NEBU
3.0000 mL | INHALATION_SOLUTION | RESPIRATORY_TRACT | Status: DC | PRN
  Filled 2013-02-15: qty 3

## 2013-02-15 MED ORDER — SIMETHICONE 80 MG PO CHEW
80.0000 mg | CHEWABLE_TABLET | ORAL | Status: DC | PRN
Start: 2013-02-15 — End: 2013-02-16

## 2013-02-15 MED ORDER — ZOLPIDEM TARTRATE 5 MG PO TABS: 5.0000 mg | ORAL_TABLET | Freq: Every evening | ORAL | Status: DC | PRN

## 2013-02-15 MED ORDER — DIPHENHYDRAMINE HCL 25 MG PO CAPS: 25.0000 mg | ORAL_CAPSULE | Freq: Four times a day (QID) | ORAL | Status: DC | PRN

## 2013-02-15 MED ORDER — ONDANSETRON HCL 4 MG PO TABS: 4.0000 mg | ORAL_TABLET | ORAL | Status: DC | PRN

## 2013-02-15 MED ORDER — OXYTOCIN 40 UNITS IN LACTATED RINGERS INFUSION - SIMPLE MED: 62.5000 mL/h | INTRAVENOUS | Status: AC | PRN

## 2013-02-15 MED ORDER — BENZOCAINE-MENTHOL 20-0.5 % EX AERO
1.0000 | INHALATION_SPRAY | CUTANEOUS | Status: DC | PRN
Start: 2013-02-15 — End: 2013-02-16
  Administered 2013-02-16: 1 via TOPICAL
  Filled 2013-02-15: qty 56

## 2013-02-15 MED ORDER — TETANUS-DIPHTH-ACELL PERTUSSIS 5-2.5-18.5 LF-MCG/0.5 IM SUSP: 0.5000 mL | Freq: Once | INTRAMUSCULAR | Status: DC

## 2013-02-15 MED ORDER — LACTATED RINGERS IV SOLN: INTRAVENOUS | Status: AC

## 2013-02-15 MED ORDER — LANOLIN HYDROUS EX OINT: TOPICAL_OINTMENT | CUTANEOUS | Status: DC | PRN

## 2013-02-15 MED ORDER — DIBUCAINE 1 % RE OINT
1.0000 "application " | TOPICAL_OINTMENT | RECTAL | Status: DC | PRN
  Administered 2013-02-16: 1 via RECTAL
  Filled 2013-02-15: qty 28

## 2013-02-15 MED ORDER — SENNOSIDES-DOCUSATE SODIUM 8.6-50 MG PO TABS
2.0000 | ORAL_TABLET | ORAL | Status: DC
  Administered 2013-02-15: 2 via ORAL
  Filled 2013-02-15: qty 2

## 2013-02-15 MED ORDER — ONDANSETRON HCL 4 MG/2ML IJ SOLN: 4.0000 mg | INTRAMUSCULAR | Status: DC | PRN

## 2013-02-15 MED ORDER — IBUPROFEN 600 MG PO TABS
600.0000 mg | ORAL_TABLET | Freq: Four times a day (QID) | ORAL | Status: DC
  Administered 2013-02-15 – 2013-02-16 (×6): 600 mg via ORAL
  Filled 2013-02-15 (×6): qty 1

## 2013-02-15 MED ORDER — OXYCODONE-ACETAMINOPHEN 5-325 MG PO TABS: 1.0000 | ORAL_TABLET | ORAL | Status: DC | PRN

## 2013-02-15 MED ORDER — MEASLES, MUMPS & RUBELLA VAC ~~LOC~~ INJ
0.5000 mL | INJECTION | Freq: Once | SUBCUTANEOUS | Status: DC
  Filled 2013-02-15: qty 0.5

## 2013-02-15 MED ORDER — WITCH HAZEL-GLYCERIN EX PADS
1.0000 | MEDICATED_PAD | CUTANEOUS | Status: DC | PRN
Start: 2013-02-15 — End: 2013-02-16
  Administered 2013-02-16: 1 via TOPICAL

## 2013-02-15 NOTE — Progress Notes (Signed)
Patient ID: Deborah Odom, female   DOB: 1985/04/11, 28 y.o.   MRN: [id number] #1 afebrile BP normal HGB stabler

## 2013-02-15 NOTE — Anesthesia Postprocedure Evaluation (Signed)
Anesthesia Post Note  Patient: Deborah Odom  Procedure(s) Performed: * No procedures listed *  Anesthesia type: Epidural  Patient location: Mother/Baby  Post pain: Pain level controlled  Post assessment: Post-op Vital signs reviewed  Last Vitals: BP 120/68  Pulse 79  Temp(Src) 36.4 C (Oral)  Resp 18  Ht 5' 5.5" (1.664 m)  Wt 191 lb (86.637 kg)  BMI 31.29 kg/m2  SpO2 98%  Post vital signs: Reviewed  Level of consciousness: awake  Complications: No apparent anesthesia complications

## 2013-02-16 MED ORDER — FERROUS SULFATE 325 (65 FE) MG PO TABS: 325.0000 mg | ORAL_TABLET | Freq: Two times a day (BID) | ORAL | Status: DC

## 2013-02-16 MED ORDER — OXYCODONE-ACETAMINOPHEN 5-325 MG PO TABS: 1.0000 | ORAL_TABLET | Freq: Four times a day (QID) | ORAL | Status: DC | PRN

## 2013-02-16 MED ORDER — IBUPROFEN 600 MG PO TABS: 600.0000 mg | ORAL_TABLET | Freq: Four times a day (QID) | ORAL | Status: DC | PRN

## 2013-02-16 NOTE — Discharge Summary (Signed)
NAMNoemi Chapel:  Kana, Michaelann              ACCOUNT NO.:  [id number]  MEDICAL RECORD NO.:  [id number]  LOCATION:  9136                          FACILITY:  [facility]  PHYSICIAN:  Malachi Prohomas F. Ambrose MantleHenley, M.D. DATE OF BIRTH:  1985-05-06  DATE OF ADMISSION:  02/14/2013 DATE OF DISCHARGE:  02/16/2013                              DISCHARGE SUMMARY   This is a 28 year old black female, para 0, gravida 1, admitted for induction of labor after a nonstress test in the office showed some early deceleration, EDC, February 12, 2013.  All prenatal labs were normal except for group B strep that was positive.  Her due date was based on an ultrasound done at 12 weeks and 2 days.  After admission to the hospital, she was placed on penicillin for group B strep prophylaxis and begun on Pitocin.  By 5:27 p.m., the Pitocin was at 5 milliunits a minute.  The patient was having painful contractions.  Cervix was 3 cm, 80-90%, vertex at -2.  The patient received an epidural.  She progressed to 8 cm and I came to the hospital.  I was called, stayed in the room just off the Labor and Delivery floor.  I was called in my room and was told the patient was 10 cm and was starting to push offered to come in the room, but the ER ensured me she would call me when she needed me. When I was called, I did arrive in the room in less than 30 seconds, but the baby had already been delivered.  The cord was still attached. Female infant had Apgars of 9 and 9 at 1 and 5 minutes.  I had the father of the baby cut the cord and the placenta was delivered spontaneously and the uterus was normal.  There was a small second- degree laceration that was repaired with 3-0 Vicryl.  There was a 2-cm thin projection of tissue from the anterior cervix that was removed and the base was sutured with 2-0 Vicryl.  There was a small laceration of the hymen at 9 o'clock, it was hemostatic and not repaired, blood loss about 400 mL.  Postpartum, the patient did well and  was discharged on the second postpartum day.  Initial hemoglobin 10.0, hematocrit 30.7, white count 8800, platelet count 189,000.  Followup hemoglobin 9.9, hematocrit 30.5, and platelet count 199,000.  RPR was nonreactive.  FINAL DIAGNOSES:  Intrauterine pregnancy at 40 weeks and 2 days, delivered vertex.  OPERATION:  Spontaneous delivery vertex, repair of second-degree midline laceration, and removal of a small projection from the cervix.  This tissue was not sent for pathology.  Final condition improved.  INSTRUCTIONS:  Include our regular discharge instruction booklet or after visit summary.  Prescriptions for Motrin 600 mg 30 tablets, 1 every 6 hours as needed for pain and Percocet 5/325, 30 tablets, 1 every 6 hours as needed for pain.  The patient is advised to return to the office in 6 weeks for followup examination to continue her prenatal vitamin pills and take ferrous sulfate 325 mg twice daily.     Malachi Prohomas F. Ambrose MantleHenley, M.D.    TFH/MEDQ  D:  02/16/2013  T:  02/16/2013  Job:  [id number] 

## 2013-02-16 NOTE — Lactation Note (Signed)
This note was copied from the chart of Deborah Odom. Lactation Consultation Note: Baby awake now and fussing. Assisted mom with latch. Reviewed wide open mouth and keeping the baby close to the breast throughout the feeding. Basic teaching reviewed with mom. No further questions at present. Reviewed BFSG and OP appointments as resources for support after DC.To call prn  Patient Name: Deborah Manfred ShirtsCharity Shimmel JYNWG'NToday's Date: 02/16/2013 Reason for consult: Follow-up assessment   Maternal Data    Feeding Feeding Type: Breast Fed Length of feed: 15 min  LATCH Score/Interventions Latch: Grasps breast easily, tongue down, lips flanged, rhythmical sucking.  Audible Swallowing: A few with stimulation  Type of Nipple: Everted at rest and after stimulation  Comfort (Breast/Nipple): Soft / non-tender     Hold (Positioning): Assistance needed to correctly position infant at breast and maintain latch. Intervention(s): Breastfeeding basics reviewed;Support Pillows;Position options  LATCH Score: 8  Lactation Tools Discussed/Used     Consult Status Consult Status: Complete Date: 02/16/13 Follow-up type: In-patient    Pamelia HoitWeeks, Dshaun Reppucci D 02/16/2013, 12:22 PM

## 2013-02-16 NOTE — Discharge Instructions (Signed)
booklet °

## 2013-02-16 NOTE — Progress Notes (Signed)
Patient ID: Deborah Odom, female   DOB: 11/07/1985, 28 y.o.   MRN: [id number] #2 afebrile BP normal no complaints For d/c

## 2013-02-20 ENCOUNTER — Inpatient Hospital Stay (HOSPITAL_COMMUNITY): Admission: RE | Admit: 2013-02-20 | Payer: Medicare HMO | Source: Ambulatory Visit

## 2013-03-06 ENCOUNTER — Emergency Department (HOSPITAL_COMMUNITY)
Admission: EM | Admit: 2013-03-06 | Discharge: 2013-03-07 | Disposition: A | Discharge status: Home / Self Care | Payer: 59 | Attending: Emergency Medicine | Admitting: Emergency Medicine

## 2013-03-06 ENCOUNTER — Encounter (HOSPITAL_COMMUNITY): Payer: Self-pay | Admitting: Emergency Medicine

## 2013-03-06 ENCOUNTER — Emergency Department (HOSPITAL_COMMUNITY): Payer: 59

## 2013-03-06 DIAGNOSIS — R5383 Other fatigue: Secondary | ICD-10-CM

## 2013-03-06 DIAGNOSIS — J45909 Unspecified asthma, uncomplicated: Secondary | ICD-10-CM | POA: Insufficient documentation

## 2013-03-06 DIAGNOSIS — R5381 Other malaise: Secondary | ICD-10-CM | POA: Insufficient documentation

## 2013-03-06 DIAGNOSIS — Z3202 Encounter for pregnancy test, result negative: Secondary | ICD-10-CM | POA: Insufficient documentation

## 2013-03-06 DIAGNOSIS — Z79899 Other long term (current) drug therapy: Secondary | ICD-10-CM | POA: Insufficient documentation

## 2013-03-06 DIAGNOSIS — R51 Headache: Secondary | ICD-10-CM | POA: Insufficient documentation

## 2013-03-06 DIAGNOSIS — N12 Tubulo-interstitial nephritis, not specified as acute or chronic: Secondary | ICD-10-CM | POA: Insufficient documentation

## 2013-03-06 LAB — COMPREHENSIVE METABOLIC PANEL
ALT: 21 U/L (ref 0–35)
AST: 24 U/L (ref 0–37)
Albumin: 3.6 g/dL (ref 3.5–5.2)
Alkaline Phosphatase: 83 U/L (ref 39–117)
BUN: 11 mg/dL (ref 6–23)
CO2: 24 mEq/L (ref 19–32)
Calcium: 9.2 mg/dL (ref 8.4–10.5)
Chloride: 99 mEq/L (ref 96–112)
Creatinine, Ser: 0.88 mg/dL (ref 0.50–1.10)
GFR calc Af Amer: >90 mL/min (ref >90)
GFR calc non Af Amer: 90 mL/min — ABNORMAL LOW (ref >90)
Glucose, Bld: 108 mg/dL — ABNORMAL HIGH (ref 70–99)
Potassium: 3.9 mEq/L (ref 3.7–5.3)
Sodium: 137 mEq/L (ref 137–147)
Total Bilirubin: 0.7 mg/dL (ref 0.3–1.2)
Total Protein: 7.9 g/dL (ref 6.0–8.3)

## 2013-03-06 LAB — URINALYSIS, ROUTINE W REFLEX MICROSCOPIC
Glucose, UA: NEGATIVE mg/dL
Ketones, ur: 15 mg/dL — AB
Nitrite: NEGATIVE
Protein, ur: 30 mg/dL — AB
Specific Gravity, Urine: 1.031 — ABNORMAL HIGH (ref 1.005–1.030)
Urobilinogen, UA: 1 mg/dL (ref 0.0–1.0)
pH: 5.5 (ref 5.0–8.0)

## 2013-03-06 LAB — CBC WITH DIFFERENTIAL/PLATELET
Basophils Absolute: 0 10*3/uL (ref 0.0–0.1)
Basophils Relative: 0 % (ref 0–1)
Eosinophils Absolute: 0 10*3/uL (ref 0.0–0.7)
Eosinophils Relative: 0 % (ref 0–5)
HCT: 37.4 % (ref 36.0–46.0)
Hemoglobin: 12.5 g/dL (ref 12.0–15.0)
Lymphocytes Relative: 9 % — ABNORMAL LOW (ref 12–46)
Lymphs Abs: 0.9 10*3/uL (ref 0.7–4.0)
MCH: 29 pg (ref 26.0–34.0)
MCHC: 33.4 g/dL (ref 30.0–36.0)
MCV: 86.8 fL (ref 78.0–100.0)
Monocytes Absolute: 0.7 10*3/uL (ref 0.1–1.0)
Monocytes Relative: 7 % (ref 3–12)
Neutro Abs: 8.6 10*3/uL — ABNORMAL HIGH (ref 1.7–7.7)
Neutrophils Relative %: 84 % — ABNORMAL HIGH (ref 43–77)
Platelets: 231 10*3/uL (ref 150–400)
RBC: 4.31 MIL/uL (ref 3.87–5.11)
RDW: 15.2 % (ref 11.5–15.5)
WBC: 10.2 10*3/uL (ref 4.0–10.5)

## 2013-03-06 LAB — URINE MICROSCOPIC-ADD ON

## 2013-03-06 LAB — PREGNANCY, URINE: Preg Test, Ur: NEGATIVE

## 2013-03-06 MED ORDER — METOCLOPRAMIDE HCL 5 MG/ML IJ SOLN
5.0000 mg | Freq: Once | INTRAMUSCULAR | Status: AC
  Administered 2013-03-06: 5 mg via INTRAVENOUS
  Filled 2013-03-06: qty 2

## 2013-03-06 MED ORDER — SODIUM CHLORIDE 0.9 % IV BOLUS (SEPSIS)
1000.0000 mL | INTRAVENOUS | Status: AC
  Administered 2013-03-06: 1000 mL via INTRAVENOUS

## 2013-03-06 MED ORDER — ACETAMINOPHEN 325 MG PO TABS
650.0000 mg | ORAL_TABLET | Freq: Once | ORAL | Status: AC
Start: 2013-03-06 — End: 2013-03-06
  Administered 2013-03-06: 650 mg via ORAL
  Filled 2013-03-06: qty 2

## 2013-03-06 MED ORDER — DIPHENHYDRAMINE HCL 50 MG/ML IJ SOLN
25.0000 mg | Freq: Once | INTRAMUSCULAR | Status: AC
Start: 2013-03-06 — End: 2013-03-06
  Administered 2013-03-06: 25 mg via INTRAVENOUS
  Filled 2013-03-06: qty 1

## 2013-03-06 MED ORDER — CEFTRIAXONE SODIUM 1 G IJ SOLR
1.0000 g | Freq: Once | INTRAMUSCULAR | Status: AC
  Administered 2013-03-07: 1 g via INTRAVENOUS
  Filled 2013-03-06: qty 10

## 2013-03-06 NOTE — ED Notes (Addendum)
X 1 day of weakness; started with a backache that has now radiated to left side. Recent preg. On 02/14/13

## 2013-03-06 NOTE — ED Provider Notes (Signed)
CSN: [id number]     Arrival date & time 03/06/13  2057 History   First MD Initiated Contact with Patient 03/06/13 2126     Chief Complaint  Patient presents with  . Back Pain  . Abdominal Pain  . Weakness     (Consider location/radiation/quality/duration/timing/severity/associated sxs/prior Treatment) Patient is a 28 y.o. female presenting with back pain, abdominal pain, and weakness. The history is provided by the patient.  Back Pain Location:  Lumbar spine (left paraspinal area) Quality:  Aching Radiates to: LLQ. Pain severity:  Mild Pain is:  Same all the time Onset quality:  Gradual Duration:  1 day Timing:  Intermittent Progression:  Unchanged Chronicity:  New Context comment:  At rest Relieved by:  Nothing Worsened by:  Nothing tried Ineffective treatments: percocet. Associated symptoms: abdominal pain, headaches and weakness (generalized)   Associated symptoms: no chest pain, no dysuria and no fever   Abdominal Pain Associated symptoms: nausea and vomiting   Associated symptoms: no chest pain, no cough, no diarrhea, no dysuria, no fatigue, no fever, no hematuria and no shortness of breath   Weakness Associated symptoms include abdominal pain and headaches. Pertinent negatives include no chest pain and no shortness of breath.    Past Medical History  Diagnosis Date  . Asthma    Past Surgical History  Procedure Laterality Date  . No past surgeries     Family History  Problem Relation Age of Onset  . Cancer Maternal Aunt   . Hypertension Maternal Grandmother   . Diabetes Maternal Grandmother    History  Substance Use Topics  . Smoking status: Never Smoker   . Smokeless tobacco: Not on file  . Alcohol Use: No   OB History   Grav Para Term Preterm Abortions TAB SAB Ect Mult Living   1 1 1       1      Review of Systems  Constitutional: Negative for fever and fatigue.  HENT: Negative for congestion and drooling.   Eyes: Negative for pain.   Respiratory: Negative for cough and shortness of breath.   Cardiovascular: Negative for chest pain.  Gastrointestinal: Positive for nausea, vomiting and abdominal pain. Negative for diarrhea.  Genitourinary: Negative for dysuria and hematuria.  Musculoskeletal: Negative for back pain, gait problem and neck pain.  Skin: Negative for color change.  Neurological: Positive for weakness (generalized) and headaches. Negative for dizziness.  Hematological: Negative for adenopathy.  Psychiatric/Behavioral: Negative for behavioral problems.  All other systems reviewed and are negative.      Allergies  Shellfish allergy and Percocet  Home Medications   Current Outpatient Rx  Name  Route  Sig  Dispense  Refill  . albuterol (PROVENTIL HFA;VENTOLIN HFA) 108 (90 BASE) MCG/ACT inhaler   Inhalation   Inhale 1-2 puffs into the lungs every 6 (six) hours as needed for wheezing or shortness of breath.         . ferrous sulfate 325 (65 FE) MG tablet   Oral   Take 325 mg by mouth 2 (two) times daily with a meal.         . Prenatal Vit-Fe Fumarate-FA (MULTIVITAMIN-PRENATAL) 27-0.8 MG TABS tablet   Oral   Take 1 tablet by mouth daily at 12 noon.          BP 114/64  Pulse 64  Temp(Src) 98.7 F (37.1 C) (Oral)  Resp 20  SpO2 96% Physical Exam  Nursing note and vitals reviewed. Constitutional: She is oriented to person, place, and  time. She appears well-developed and well-nourished.  HENT:  Head: Normocephalic.  Mouth/Throat: No oropharyngeal exudate.  Eyes: Conjunctivae and EOM are normal. Pupils are equal, round, and reactive to light.  Neck: Normal range of motion. Neck supple.  Cardiovascular: Normal rate, regular rhythm, normal heart sounds and intact distal pulses.  Exam reveals no gallop and no friction rub.   No murmur heard. Pulmonary/Chest: Effort normal and breath sounds normal. No respiratory distress. She has no wheezes.  Abdominal: Soft. Bowel sounds are normal. There  is no tenderness. There is no rebound and no guarding.  Musculoskeletal: Normal range of motion. She exhibits no edema and no tenderness.  No CVA tenderness to palpation bilaterally.  Neurological: She is alert and oriented to person, place, and time.  Skin: Skin is warm and dry.  Psychiatric: She has a normal mood and affect. Her behavior is normal.    ED Course  Procedures (including critical care time) Labs Review Labs Reviewed  CBC WITH DIFFERENTIAL - Abnormal; Notable for the following:    Neutrophils Relative % 84 (*)    Neutro Abs 8.6 (*)    Lymphocytes Relative 9 (*)    All other components within normal limits  COMPREHENSIVE METABOLIC PANEL - Abnormal; Notable for the following:    Glucose, Bld 108 (*)    GFR calc non Af Amer 90 (*)    All other components within normal limits  URINALYSIS, ROUTINE W REFLEX MICROSCOPIC - Abnormal; Notable for the following:    Color, Urine AMBER (*)    APPearance CLOUDY (*)    Specific Gravity, Urine 1.031 (*)    Hgb urine dipstick MODERATE (*)    Bilirubin Urine SMALL (*)    Ketones, ur 15 (*)    Protein, ur 30 (*)    Leukocytes, UA LARGE (*)    All other components within normal limits  URINE MICROSCOPIC-ADD ON - Abnormal; Notable for the following:    Squamous Epithelial / LPF MANY (*)    Bacteria, UA FEW (*)    All other components within normal limits  URINE CULTURE  PREGNANCY, URINE   Imaging Review Ct Abdomen Pelvis Wo Contrast  03/06/2013   CLINICAL DATA:  Weakness, back ache radiating to left side.  EXAM: CT ABDOMEN AND PELVIS WITHOUT CONTRAST  TECHNIQUE: Multidetector CT imaging of the abdomen and pelvis was performed following the standard protocol without intravenous contrast.  COMPARISON:  None available  FINDINGS: The visualized lung bases are clear.  Limited noncontrast evaluation of the liver is unremarkable. The gallbladder is within normal limits. No biliary ductal dilatation. The spleen, adrenal glands, and pancreas  demonstrate a normal unenhanced appearance.  Kidneys are equal in size without evidence of nephrolithiasis or hydronephrosis. No stone seen along the course of either renal collecting system. There is no hydroureter.  No evidence of bowel obstruction. Stomach is within normal limits. Appendix is not definitely visualized in the right lower quadrant, however, no inflammatory changes seen within this region or about the cecum to suggest acute appendicitis. No abnormal bowel wall thickening or inflammatory fat stranding seen about the bowels. There is diastasis of the rectus abdominis musculature at the level of the emboli kiss.  Bladder is largely decompressed and not well evaluated. The uterus is mildly prominent, which may be related to recent pregnancy. Simple cystic lesion measuring 2.8 cm within the right adnexa likely represents a physiologic ovarian cyst. Ovaries are otherwise within normal limits.  No free air or fluid. Shotty subcentimeter periaortic  adenopathy is noted. No pathologically enlarged intra-abdominal or pelvic lymph nodes identified.  No acute osseous abnormality. No focal lytic or blastic osseous lesions.  IMPRESSION: 1. No CT evidence of nephrolithiasis or obstructive uropathy identified. 2. No acute intra-abdominal or pelvic process identified.   Electronically Signed   By: Rise MuBenjamin  McClintock M.D.   On: 03/06/2013 23:07    EKG Interpretation   None       MDM   Final diagnoses:  Pyelonephritis    9:56 PM 28 y.o. female status post spontaneous vaginal delivery on February 6 who presents with left lower back pain which began yesterday. She has also had vomiting and gradual onset headache which began yesterday. She denies any complications during her pregnancy including blood pressure issues or blood sugar issues. She notes continued lochia which is decreasing in flow and currently whitish yellow in appearance. She denies any dysuria. She denies any fevers at home. She is afebrile  and vital signs are unremarkable here. Her abdomen is benign on exam. Will get screening labwork, urinalysis, treatment of headache and CT scan of abdomen to rule out kidney stone.  CT neg. UA concerning for infection. Suspicious for early pyelo. Pt tolerating po here. Got rocephin 1g IV. I discussed medications for home w/ the pt and recommended she feed her baby formula until she is off the medications. She understands.    I have discussed the diagnosis/risks/treatment options with the patient and family and believe the pt to be eligible for discharge home to follow-up with pcp as needed. We also discussed returning to the ED immediately if new or worsening sx occur. We discussed the sx which are most concerning (e.g., worsening pain, fever, inability to take abx or tolerate po) that necessitate immediate return. Medications administered to the patient during their visit and any new prescriptions provided to the patient are listed below.  Medications given during this visit Medications  sodium chloride 0.9 % bolus 1,000 mL (0 mLs Intravenous Stopped 03/07/13 0123)  acetaminophen (TYLENOL) tablet 650 mg (650 mg Oral Given 03/06/13 2339)  metoCLOPramide (REGLAN) injection 5 mg (5 mg Intravenous Given 03/06/13 2351)  diphenhydrAMINE (BENADRYL) injection 25 mg (25 mg Intravenous Given 03/06/13 2351)  cefTRIAXone (ROCEPHIN) 1 g in dextrose 5 % 50 mL IVPB (0 g Intravenous Stopped 03/07/13 0110)    Discharge Medication List as of 03/07/2013 12:37 AM    START taking these medications   Details  ciprofloxacin (CIPRO) 500 MG tablet Take 1 tablet (500 mg total) by mouth 2 (two) times daily. One po bid x 7 days, Starting 03/07/2013, Until Discontinued, Print    ondansetron (ZOFRAN ODT) 4 MG disintegrating tablet 4mg  ODT q4 hours prn nausea/vomit, Print    traMADol (ULTRAM) 50 MG tablet Take 1 tablet (50 mg total) by mouth every 6 (six) hours as needed for moderate pain., Starting 03/07/2013, Until Discontinued,  Print           Junius ArgyleForrest S Kush [place], MD 03/07/13 [phone]

## 2013-03-06 NOTE — ED Notes (Signed)
Tech did an in and out cath with no return on urine. Did a bladder scan, pt had 25cc in bladder.

## 2013-03-06 NOTE — ED Notes (Signed)
Pt states that she has been breast feeding her baby, but reports that she has been too weak for the last 2 days.

## 2013-03-06 NOTE — ED Notes (Signed)
Pt states that she has been feeling warm, but has not had access to a thermometer to measure her temperature. Pt reports hot flashes, cold chills, and warm skin. Pt also reports 2 episodes of vomiting on Tuesday.

## 2013-03-06 NOTE — ED Notes (Signed)
Harrison, MD at bedside. 

## 2013-03-07 DIAGNOSIS — N12 Tubulo-interstitial nephritis, not specified as acute or chronic: Secondary | ICD-10-CM | POA: Diagnosis present

## 2013-03-07 MED ORDER — CIPROFLOXACIN HCL 500 MG PO TABS: 500.0000 mg | ORAL_TABLET | Freq: Two times a day (BID) | ORAL | Status: DC

## 2013-03-07 MED ORDER — TRAMADOL HCL 50 MG PO TABS: 50.0000 mg | ORAL_TABLET | Freq: Four times a day (QID) | ORAL | Status: DC | PRN

## 2013-03-07 MED ORDER — ONDANSETRON 4 MG PO TBDP: ORAL_TABLET | ORAL | Status: DC

## 2013-03-07 NOTE — Discharge Instructions (Signed)

## 2013-03-07 NOTE — ED Notes (Signed)
Harrison, MD at bedside. 

## 2013-03-08 LAB — URINE CULTURE
Colony Count: NO GROWTH
Culture: NO GROWTH

## 2013-11-10 ENCOUNTER — Encounter (HOSPITAL_COMMUNITY): Payer: Self-pay | Admitting: Emergency Medicine

## 2014-04-10 ENCOUNTER — Encounter (HOSPITAL_COMMUNITY): Payer: Self-pay

## 2014-04-10 ENCOUNTER — Emergency Department (HOSPITAL_COMMUNITY): Payer: 59

## 2014-04-10 ENCOUNTER — Emergency Department (HOSPITAL_COMMUNITY)
Admission: EM | Admit: 2014-04-10 | Discharge: 2014-04-10 | Disposition: A | Discharge status: Home / Self Care | Payer: 59 | Attending: Emergency Medicine | Admitting: Emergency Medicine

## 2014-04-10 DIAGNOSIS — J45909 Unspecified asthma, uncomplicated: Secondary | ICD-10-CM | POA: Insufficient documentation

## 2014-04-10 DIAGNOSIS — R109 Unspecified abdominal pain: Secondary | ICD-10-CM

## 2014-04-10 DIAGNOSIS — R1032 Left lower quadrant pain: Secondary | ICD-10-CM | POA: Diagnosis present

## 2014-04-10 DIAGNOSIS — R197 Diarrhea, unspecified: Secondary | ICD-10-CM | POA: Insufficient documentation

## 2014-04-10 DIAGNOSIS — N83209 Unspecified ovarian cyst, unspecified side: Secondary | ICD-10-CM

## 2014-04-10 DIAGNOSIS — Z3202 Encounter for pregnancy test, result negative: Secondary | ICD-10-CM | POA: Diagnosis not present

## 2014-04-10 DIAGNOSIS — Z79899 Other long term (current) drug therapy: Secondary | ICD-10-CM | POA: Insufficient documentation

## 2014-04-10 DIAGNOSIS — R05 Cough: Secondary | ICD-10-CM | POA: Insufficient documentation

## 2014-04-10 DIAGNOSIS — Z792 Long term (current) use of antibiotics: Secondary | ICD-10-CM | POA: Insufficient documentation

## 2014-04-10 DIAGNOSIS — N8320 Unspecified ovarian cysts: Secondary | ICD-10-CM | POA: Diagnosis not present

## 2014-04-10 LAB — LIPASE, BLOOD: Lipase: 20 U/L (ref 11–59)

## 2014-04-10 LAB — URINALYSIS, ROUTINE W REFLEX MICROSCOPIC
Bilirubin Urine: NEGATIVE
Glucose, UA: NEGATIVE mg/dL
Hgb urine dipstick: NEGATIVE
Ketones, ur: NEGATIVE mg/dL
Leukocytes, UA: NEGATIVE
Nitrite: NEGATIVE
Protein, ur: 100 mg/dL — AB
Specific Gravity, Urine: 1.024 (ref 1.005–1.030)
Urobilinogen, UA: 0.2 mg/dL (ref 0.0–1.0)
pH: 8.5 — ABNORMAL HIGH (ref 5.0–8.0)

## 2014-04-10 LAB — COMPREHENSIVE METABOLIC PANEL
ALT: 31 U/L (ref 0–35)
AST: 31 U/L (ref 0–37)
Albumin: 4.2 g/dL (ref 3.5–5.2)
Alkaline Phosphatase: 50 U/L (ref 39–117)
Anion gap: 9 (ref 5–15)
BUN: 8 mg/dL (ref 6–23)
CO2: 20 mmol/L (ref 19–32)
Calcium: 9.4 mg/dL (ref 8.4–10.5)
Chloride: 107 mmol/L (ref 96–112)
Creatinine, Ser: 0.62 mg/dL (ref 0.50–1.10)
GFR calc Af Amer: >90 mL/min (ref >90)
GFR calc non Af Amer: >90 mL/min (ref >90)
Glucose, Bld: 116 mg/dL — ABNORMAL HIGH (ref 70–99)
Potassium: 3.4 mmol/L — ABNORMAL LOW (ref 3.5–5.1)
Sodium: 136 mmol/L (ref 135–145)
Total Bilirubin: 1 mg/dL (ref 0.3–1.2)
Total Protein: 7.2 g/dL (ref 6.0–8.3)

## 2014-04-10 LAB — URINE MICROSCOPIC-ADD ON

## 2014-04-10 LAB — CBC WITH DIFFERENTIAL/PLATELET
Basophils Absolute: 0 10*3/uL (ref 0.0–0.1)
Basophils Relative: 0 % (ref 0–1)
Eosinophils Absolute: 0 10*3/uL (ref 0.0–0.7)
Eosinophils Relative: 0 % (ref 0–5)
HCT: 36.4 % (ref 36.0–46.0)
Hemoglobin: 12.4 g/dL (ref 12.0–15.0)
Lymphocytes Relative: 12 % (ref 12–46)
Lymphs Abs: 1 10*3/uL (ref 0.7–4.0)
MCH: 29.3 pg (ref 26.0–34.0)
MCHC: 34.1 g/dL (ref 30.0–36.0)
MCV: 86.1 fL (ref 78.0–100.0)
Monocytes Absolute: 0.5 10*3/uL (ref 0.1–1.0)
Monocytes Relative: 7 % (ref 3–12)
Neutro Abs: 6.6 10*3/uL (ref 1.7–7.7)
Neutrophils Relative %: 81 % — ABNORMAL HIGH (ref 43–77)
Platelets: 193 10*3/uL (ref 150–400)
RBC: 4.23 MIL/uL (ref 3.87–5.11)
RDW: 13.3 % (ref 11.5–15.5)
WBC: 8.2 10*3/uL (ref 4.0–10.5)

## 2014-04-10 LAB — POC URINE PREG, ED: Preg Test, Ur: NEGATIVE

## 2014-04-10 MED ORDER — HYDROCODONE-ACETAMINOPHEN 5-325 MG PO TABS: 1.0000 | ORAL_TABLET | ORAL | Status: DC | PRN

## 2014-04-10 MED ORDER — MORPHINE SULFATE 4 MG/ML IJ SOLN
4.0000 mg | Freq: Once | INTRAMUSCULAR | Status: AC
  Administered 2014-04-10: 4 mg via INTRAVENOUS
  Filled 2014-04-10: qty 1

## 2014-04-10 MED ORDER — SODIUM CHLORIDE 0.9 % IV BOLUS (SEPSIS)
1000.0000 mL | Freq: Once | INTRAVENOUS | Status: AC
  Administered 2014-04-10: 1000 mL via INTRAVENOUS

## 2014-04-10 MED ORDER — PROMETHAZINE HCL 25 MG/ML IJ SOLN
12.5000 mg | Freq: Once | INTRAMUSCULAR | Status: AC
  Administered 2014-04-10: 12.5 mg via INTRAVENOUS
  Filled 2014-04-10: qty 1

## 2014-04-10 MED ORDER — ONDANSETRON HCL 4 MG/2ML IJ SOLN
4.0000 mg | Freq: Once | INTRAMUSCULAR | Status: AC
  Administered 2014-04-10: 4 mg via INTRAVENOUS
  Filled 2014-04-10: qty 2

## 2014-04-10 MED ORDER — PROMETHAZINE HCL 25 MG PO TABS: 25.0000 mg | ORAL_TABLET | Freq: Four times a day (QID) | ORAL | Status: DC | PRN

## 2014-04-10 MED ORDER — IOHEXOL 300 MG/ML  SOLN
100.0000 mL | Freq: Once | INTRAMUSCULAR | Status: AC | PRN
  Administered 2014-04-10: 100 mL via INTRAVENOUS

## 2014-04-10 MED ORDER — IOHEXOL 300 MG/ML  SOLN
25.0000 mL | Freq: Once | INTRAMUSCULAR | Status: AC | PRN
  Administered 2014-04-10: 25 mL via ORAL

## 2014-04-10 MED ORDER — KETOROLAC TROMETHAMINE 30 MG/ML IJ SOLN
30.0000 mg | Freq: Once | INTRAMUSCULAR | Status: AC
  Administered 2014-04-10: 30 mg via INTRAVENOUS
  Filled 2014-04-10: qty 1

## 2014-04-10 NOTE — ED Notes (Signed)
Pt c/o being awakened by left upper abdominal throbbing/aching pain beginning around 0430 today. Pt states she has vomited x5, diarrhea x3.

## 2014-04-10 NOTE — ED Provider Notes (Signed)
CSN: [id number]     Arrival date & time 04/10/14  0727 History   First MD Initiated Contact with Patient 04/10/14 [phone]     Chief Complaint  Patient presents with  . Abdominal Pain     (Consider location/radiation/quality/duration/timing/severity/associated sxs/prior Treatment) Patient is a 29 y.o. female presenting with abdominal pain. The history is provided by the patient.  Abdominal Pain Pain location:  LLQ, LUQ and epigastric Pain quality: gnawing and sharp   Pain radiates to:  Does not radiate Pain severity:  Severe Onset quality:  Sudden Duration:  3 hours Timing:  Constant Progression:  Unchanged Chronicity:  New Context: awakening from sleep   Relieved by:  Nothing Worsened by:  Nothing tried Ineffective treatments:  Vomiting (diarrhea) Associated symptoms: cough, diarrhea, nausea and vomiting   Associated symptoms: no chest pain, no dysuria, no fever, no vaginal bleeding and no vaginal discharge   Associated symptoms comment:  Pt states for the last few days she has had URI sx with scratchy throat and mild cough and last night had some nausea but woke up this morning with severe abd pain Risk factors: no alcohol abuse     Past Medical History  Diagnosis Date  . Asthma    Past Surgical History  Procedure Laterality Date  . No past surgeries     Family History  Problem Relation Age of Onset  . Cancer Maternal Aunt   . Hypertension Maternal Grandmother   . Diabetes Maternal Grandmother    History  Substance Use Topics  . Smoking status: Never Smoker   . Smokeless tobacco: Not on file  . Alcohol Use: No   OB History    Gravida Para Term Preterm AB TAB SAB Ectopic Multiple Living   Review of Systems  Constitutional: Negative for fever.  Respiratory: Positive for cough.   Cardiovascular: Negative for chest pain.  Gastrointestinal: Positive for nausea, vomiting, abdominal pain and diarrhea.  Genitourinary: Negative for dysuria, vaginal  bleeding and vaginal discharge.  All other systems reviewed and are negative.     Allergies  Shellfish allergy and Percocet  Home Medications   Prior to Admission medications   Medication Sig Start Date End Date Taking? Authorizing Provider  albuterol (PROVENTIL HFA;VENTOLIN HFA) 108 (90 BASE) MCG/ACT inhaler Inhale 1-2 puffs into the lungs every 6 (six) hours as needed for wheezing or shortness of breath.    Historical Provider, MD  ciprofloxacin (CIPRO) 500 MG tablet Take 1 tablet (500 mg total) by mouth 2 (two) times daily. One po bid x 7 days 03/07/13   Purvis Sheffield, MD  ferrous sulfate 325 (65 FE) MG tablet Take 325 mg by mouth 2 (two) times daily with a meal.    Historical Provider, MD  ondansetron (ZOFRAN ODT) 4 MG disintegrating tablet  ODT q4 hours prn nausea/vomit 03/07/13   Purvis Sheffield, MD  Prenatal Vit-Fe Fumarate-FA (MULTIVITAMIN-PRENATAL) 27-0.8 MG TABS tablet Take 1 tablet by mouth daily at 12 noon.    Historical Provider, MD  traMADol (ULTRAM) 50 MG tablet Take 1 tablet (50 mg total) by mouth every 6 (six) hours as needed for moderate pain. 03/07/13   Purvis Sheffield, MD   BP 131/70 mmHg  Pulse 80  Temp(Src) 97.8 F (36.6 C) (Oral)  Resp 18  SpO2 96% Physical Exam  Constitutional: She is oriented to person, place, and time. She appears well-developed and well-nourished. She appears distressed.  Appears uncomfortable  HENT:  Head: Normocephalic and atraumatic.  Eyes: EOM are normal. Pupils are equal, round, and reactive to light.  Cardiovascular: Normal rate, regular rhythm, normal heart sounds and intact distal pulses.  Exam reveals no friction rub.   No murmur heard. Pulmonary/Chest: Effort normal and breath sounds normal. She has no wheezes. She has no rales.  Abdominal: Soft. Bowel sounds are normal. She exhibits no distension. There is tenderness in the epigastric area, left upper quadrant and left lower quadrant. There is no rebound and no guarding.   Musculoskeletal: Normal range of motion. She exhibits no tenderness.  No edema  Neurological: She is alert and oriented to person, place, and time. No cranial nerve deficit.  Skin: Skin is warm and dry. No rash noted.  Psychiatric: She has a normal mood and affect. Her behavior is normal.  Nursing note and vitals reviewed.   ED Course  Procedures (including critical care time) Labs Review Labs Reviewed  CBC WITH DIFFERENTIAL/PLATELET - Abnormal; Notable for the following:    Neutrophils Relative % 81 (*)    All other components within normal limits  COMPREHENSIVE METABOLIC PANEL - Abnormal; Notable for the following:    Potassium 3.4 (*)    Glucose, Bld 116 (*)    All other components within normal limits  URINALYSIS, ROUTINE W REFLEX MICROSCOPIC - Abnormal; Notable for the following:    APPearance CLOUDY (*)    pH 8.5 (*)    Protein, ur 100 (*)    All other components within normal limits  URINE MICROSCOPIC-ADD ON - Abnormal; Notable for the following:    Squamous Epithelial / LPF FEW (*)    Bacteria, UA FEW (*)    All other components within normal limits  LIPASE, BLOOD  POC URINE PREG, ED    Imaging Review Ct Abdomen Pelvis W Contrast  04/10/2014   CLINICAL DATA:  One day history of diffuse pain with nausea and vomiting  EXAM: CT ABDOMEN AND PELVIS WITH CONTRAST  TECHNIQUE: Multidetector CT imaging of the abdomen and pelvis was performed using the standard protocol following bolus administration of intravenous contrast. Patient could not tolerate oral contrast administration.  CONTRAST:  OMNIPAQUE IOHEXOL 300 MG/ML  SOLN  COMPARISON:  March 06, 2013  FINDINGS: Lung bases are clear.  No focal liver lesions are identified. Gallbladder wall is not thickened. There is no biliary duct dilatation.  Spleen, pancreas, and adrenals appear normal. Kidneys bilaterally show no mass or hydronephrosis on either side. There is no renal or ureteral calculus apparent on either side.  In  the pelvis, the urinary bladder is midline with normal wall thickness. There is evidence of a collapsed cyst in the right ovary with enhancement of the wall of this cystic area. This cystic area with enhancing wall measures 2.3 x 1.8 cm. There is a small amount of fluid tracking from the right ovary into the cul-de-sac region. No other pelvic mass is seen.  Appendix appears within normal limits.  There is no bowel obstruction.  No free air or portal venous air.  There is no adenopathy or abscess in the abdomen or pelvis. There is no abdominal aortic aneurysm. There are no blastic or lytic bone lesions. There is a minimal ventral hernia containing only fat.  IMPRESSION: Findings consistent with recent ovarian cyst rupture with fluid tracking from the right ovary into the cul-de-sac. Nearby appendix appears within normal limits. Appendix is actually best seen on sagittal view on this study.  No bowel obstruction. No  abscess. No free air. There is a minimal ventral hernia containing only fat.   Electronically Signed   By: Bretta BangWilliam  Woodruff III M.D.   On: 04/10/2014 12:00     EKG Interpretation None      MDM   Final diagnoses:  Abdominal pain  Ruptured ovarian cyst   Patient presenting with the history of sudden epigastric, left upper quadrant and left lower quadrant abdominal pain that started a proximally 3 hours to arrival. She has had vomiting and diarrhea but states the pain will not resolve. Patient does have a prior history of pyelonephritis but states that this feels nothing like that. She denies any urinary symptoms today. LMP has been within the last 28 days.  Patient states prior to this starting she has had a mild scratchy throat and minimal cough for the last few days but no fever, productive cough, shortness of breath.  Patient is uncomfortable and tender in her epigastric, left upper quadrant and left lower quadrant on exam. Differential includes viral etiology versus pancreatitis versus  kidney stone versus ovarian cyst with lower suspicion for pyelonephritis, lung pathology, ectopic pregnancy, cholecystitis or appendicitis.  Patient given IV fluids, pain and nausea management. CBC, CMP, lipase, UA, UPT pending   9:43 AM Labs are within normal limits however after pain and nausea medication patient still has significant pain on exam but she states it moved slightly more into the lower quadrants. Difficult to discern if this is an atypical appendicitis versus a kidney stone. Will do CT for further evaluation.  12:08 PM CT showed a ruptured ovarian cyst. Will attempt to achieve pain and nausea control and then we'll discharge patient home.  Gwyneth SproutWhitney Jasani Lengel, MD 04/10/14 1540

## 2014-07-05 IMAGING — CT CT ABD-PELV W/O CM
2 of 4 series · 15 of 46 positions shown, 17 images · non-contrast
Comparison: None available

CLINICAL DATA: Weakness, back ache radiating to left side.

EXAM:
CT ABDOMEN AND PELVIS WITHOUT CONTRAST
TECHNIQUE: Multidetector CT imaging of the abdomen and pelvis was performed
following the standard protocol without intravenous contrast.

[Series 2: abd/ pelvis 5.0 i30f 1 · axial · 0.68mm/px · z∈[-455,-40]mm · 12 of 91 slices shown, 14 images]
[im 4/91  soft-tissue]
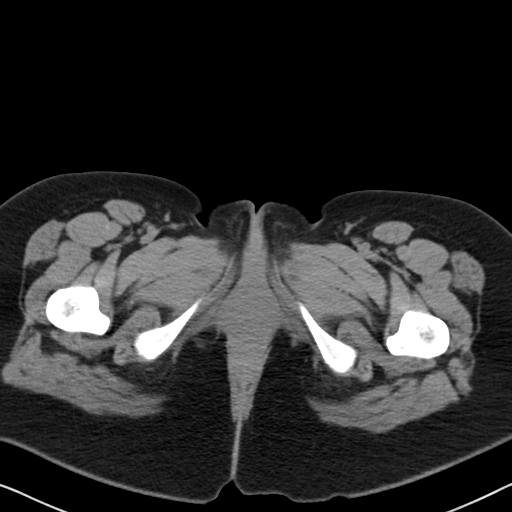
[im 4/91  bone]
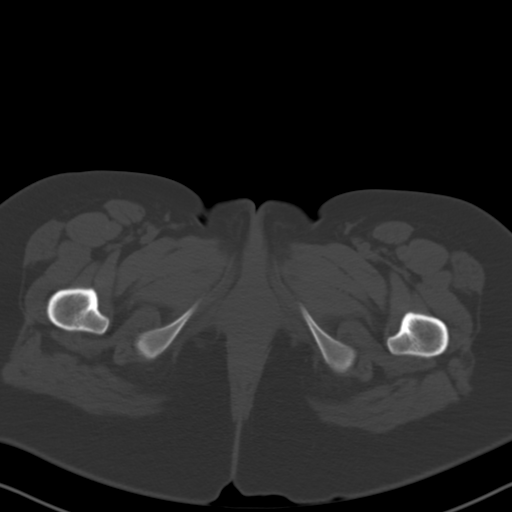
[im 12/91  soft-tissue]
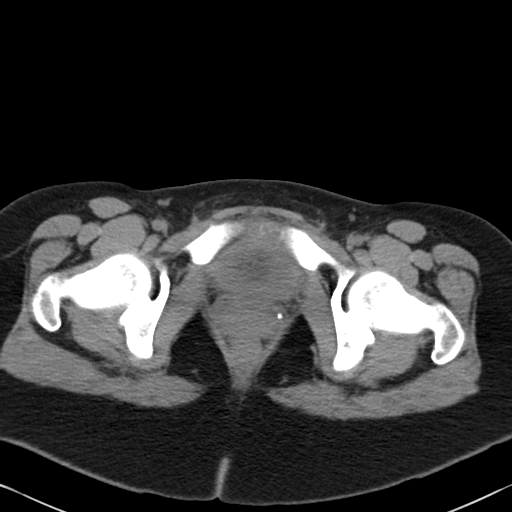
[im 19/91  soft-tissue]
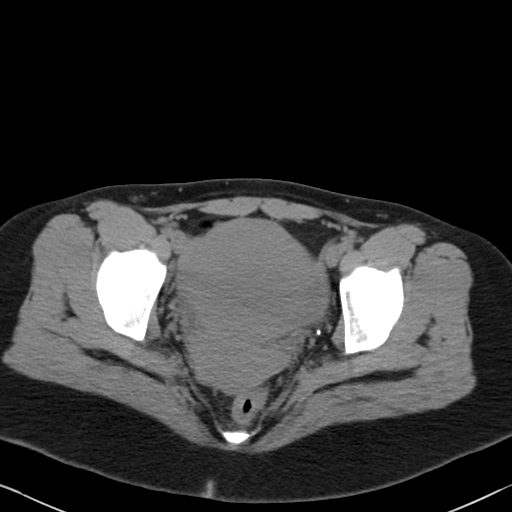
[im 27/91  soft-tissue]
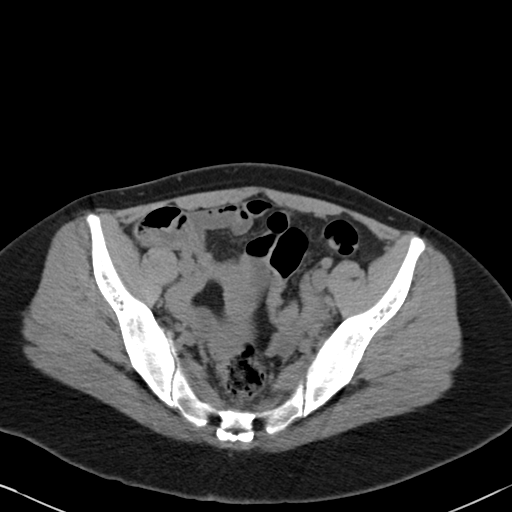
[im 34/91  soft-tissue]
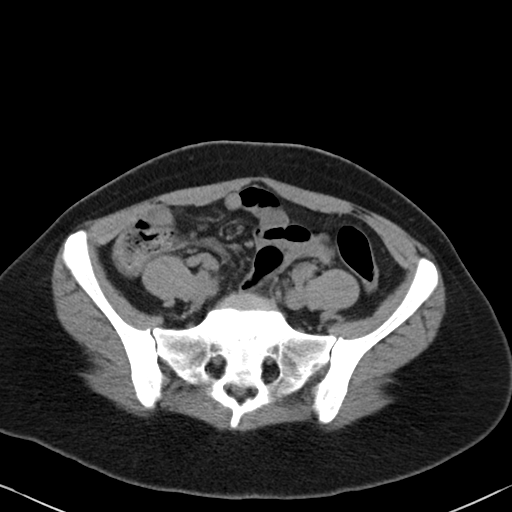
[im 42/91  soft-tissue]
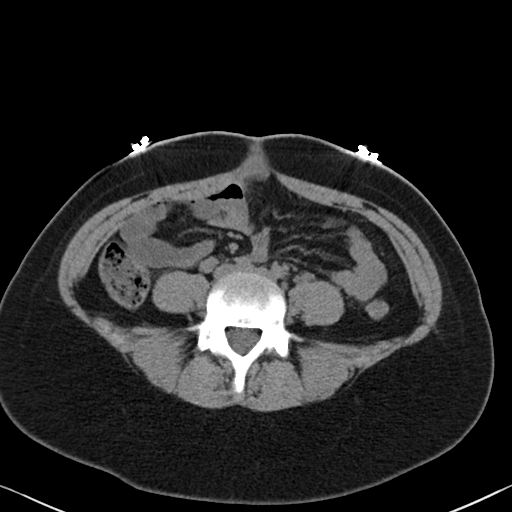
[im 49/91  soft-tissue]
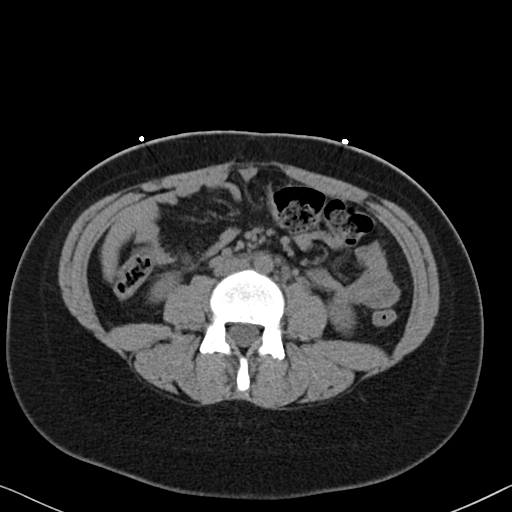
[im 57/91  soft-tissue]
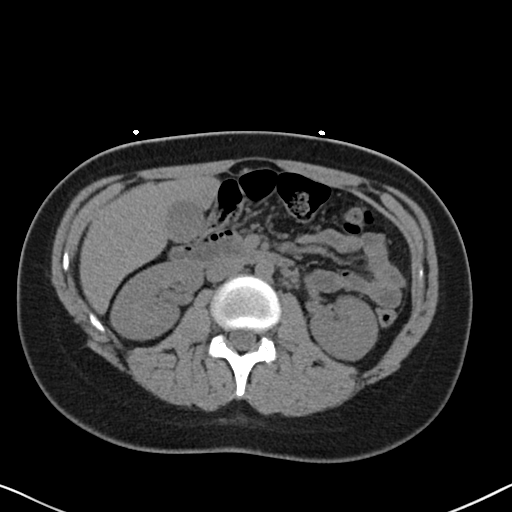
[im 64/91  soft-tissue]
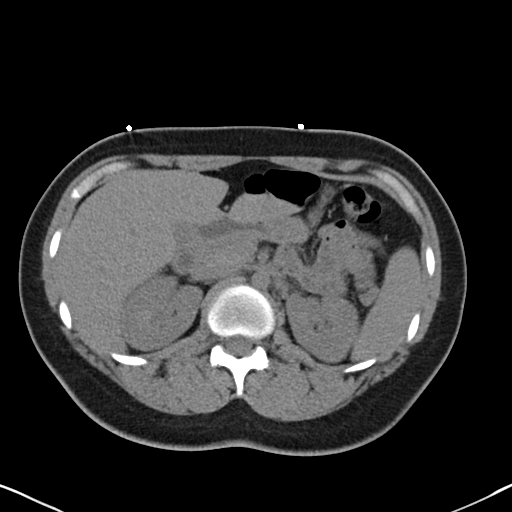
[im 64/91  bone]
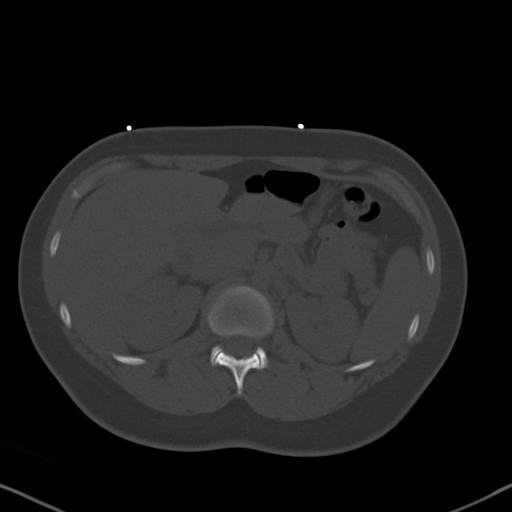
[im 72/91  soft-tissue]
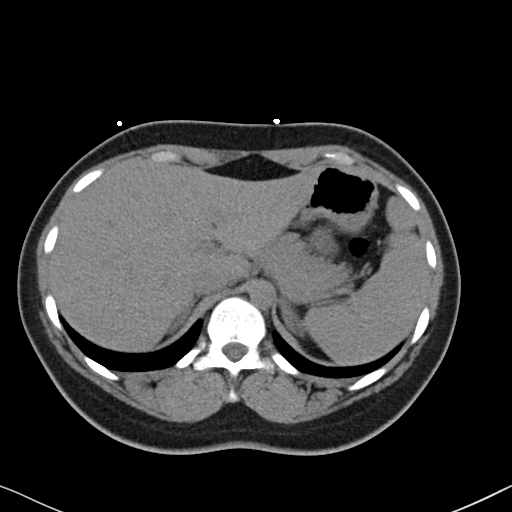
[im 79/91  soft-tissue]
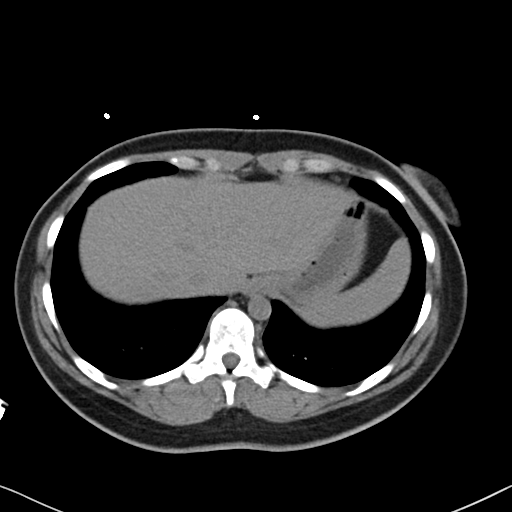
[im 87/91  soft-tissue]
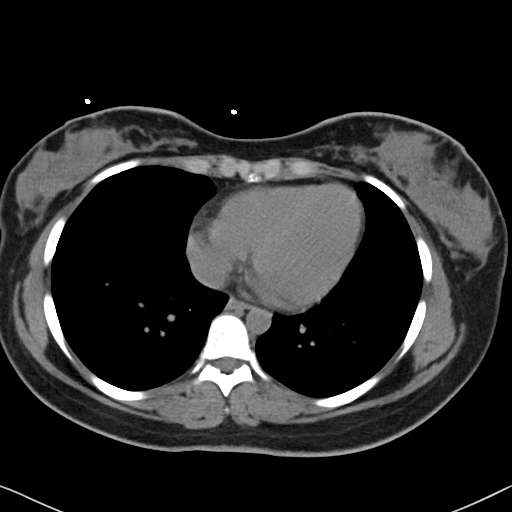

[Series 4: coronals · coronal · 0.63mm/px · 3 of 65 slices shown]
[im 22/65  soft-tissue]
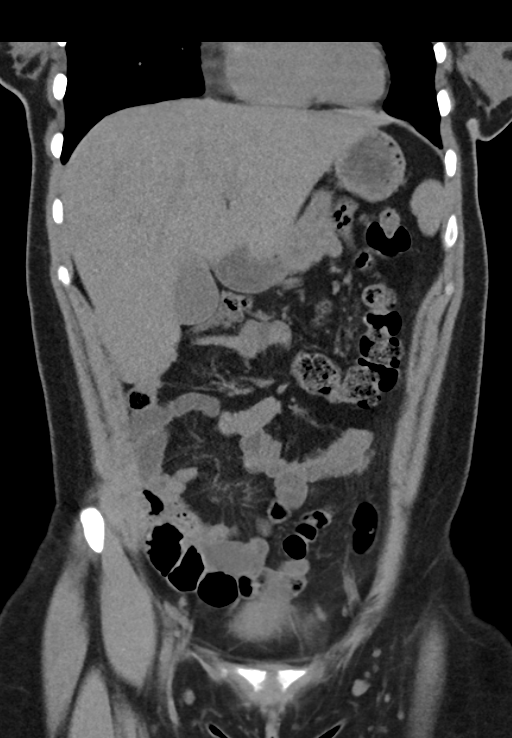
[im 29/65  soft-tissue]
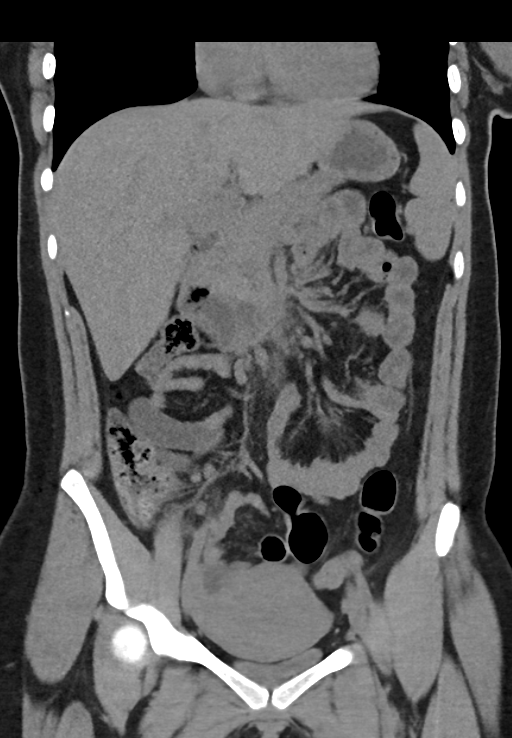
[im 36/65  soft-tissue]
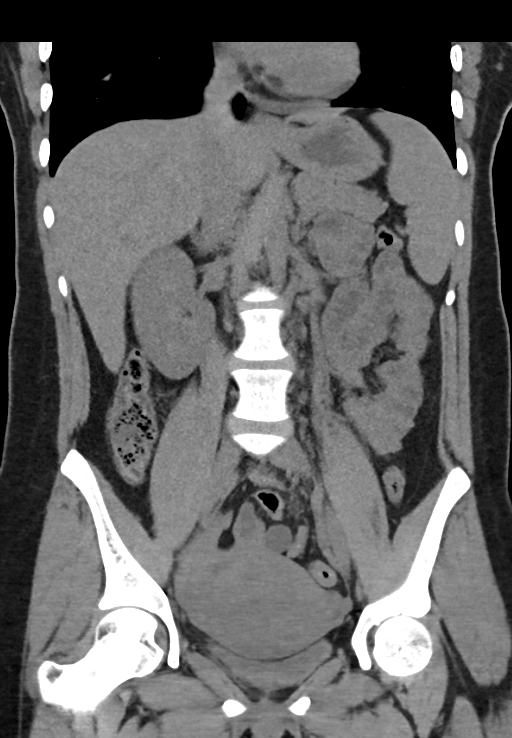

[15 of 46 positions shown; findings below may reference images not displayed]

FINDINGS: The visualized lung bases are clear.

Limited noncontrast evaluation of the liver is unremarkable. The
gallbladder is within normal limits. No biliary ductal dilatation.
The spleen, adrenal glands, and pancreas demonstrate a normal
unenhanced appearance.

Kidneys are equal in size without evidence of nephrolithiasis or
hydronephrosis. No stone seen along the course of either renal
collecting system. There is no hydroureter.

No evidence of bowel obstruction. Stomach is within normal limits.
Appendix is not definitely visualized in the right lower quadrant,
however, no inflammatory changes seen within this region or about
the cecum to suggest acute appendicitis. No abnormal bowel wall
thickening or inflammatory fat stranding seen about the bowels.
There is diastasis of the rectus abdominis musculature at the level
of the emboli kiss.

Bladder is largely decompressed and not well evaluated. The uterus
is mildly prominent, which may be related to recent pregnancy.
Simple cystic lesion measuring 2.8 cm within the right adnexa likely
represents a physiologic ovarian cyst. Ovaries are otherwise within
normal limits.

No free air or fluid. Shotty subcentimeter periaortic adenopathy is
noted. No pathologically enlarged intra-abdominal or pelvic lymph
nodes identified.

No acute osseous abnormality. No focal lytic or blastic osseous
lesions.
IMPRESSION: 1. No CT evidence of nephrolithiasis or obstructive uropathy
identified.
2. No acute intra-abdominal or pelvic process identified.

## 2015-08-09 IMAGING — CT CT ABD-PELV W/ CM
2 of 4 series · 15 of 46 positions shown, 17 images · IV contrast (APPLIED)
Comparison: March 06, 2013

CLINICAL DATA: One day history of diffuse pain with nausea and
vomiting

EXAM:
CT ABDOMEN AND PELVIS WITH CONTRAST
TECHNIQUE: Multidetector CT imaging of the abdomen and pelvis was performed
using the standard protocol following bolus administration of
intravenous contrast. Patient could not tolerate oral contrast
administration.
CONTRAST:  100mL OMNIPAQUE IOHEXOL 300 MG/ML  SOLN

[Series 2: abd/ pelvis 5.0 i30f 1 · axial · 0.66mm/px · z∈[+454,+824]mm · 12 of 85 slices shown, 14 images]
[im 7/85  soft-tissue]
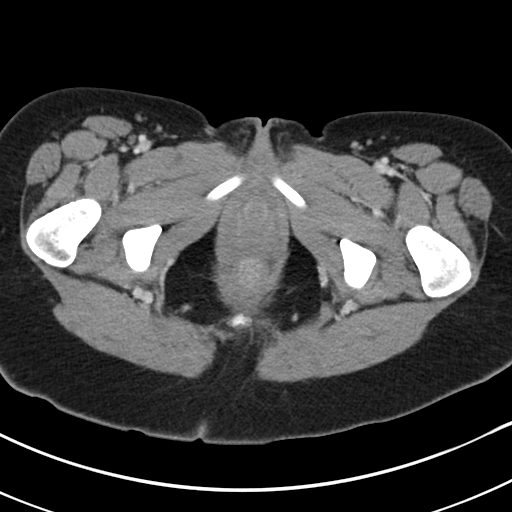
[im 7/85  bone]
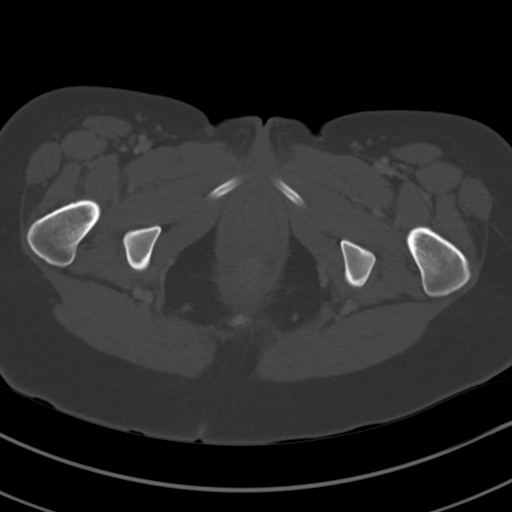
[im 14/85  soft-tissue]
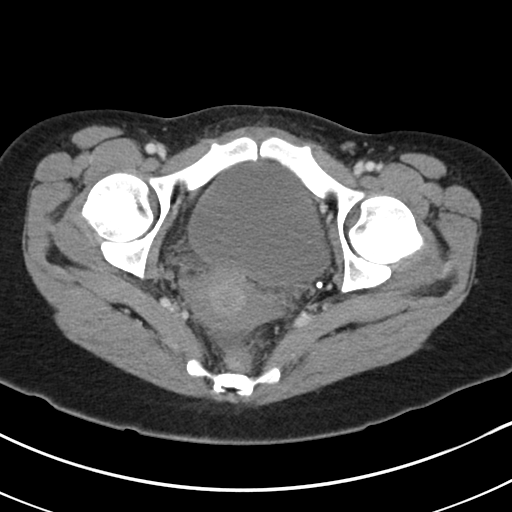
[im 21/85  soft-tissue]
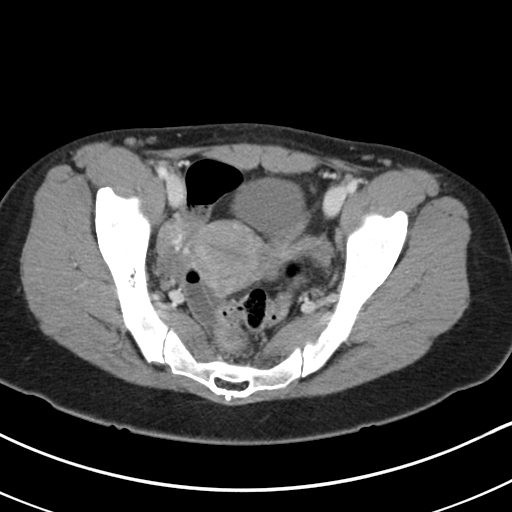
[im 27/85  soft-tissue]
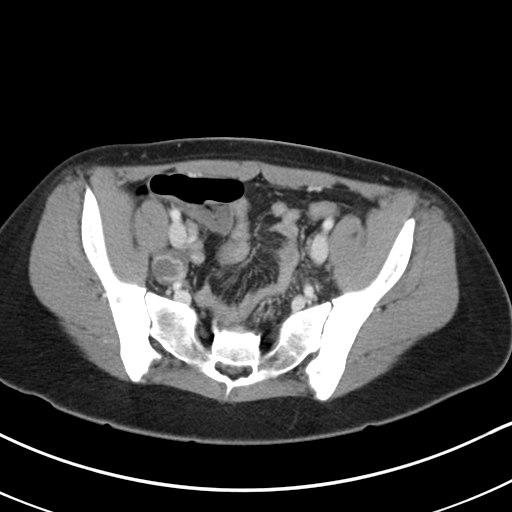
[im 34/85  soft-tissue]
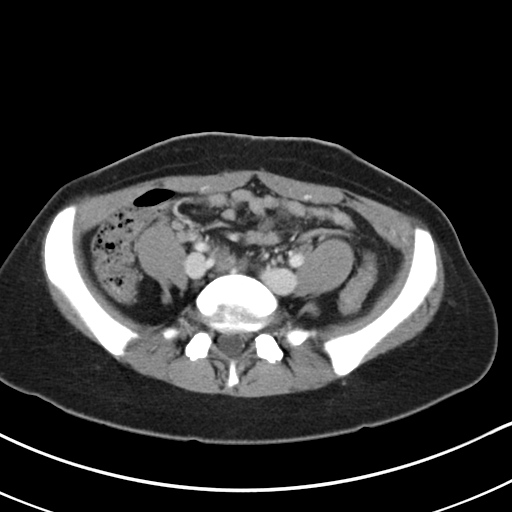
[im 41/85  soft-tissue]
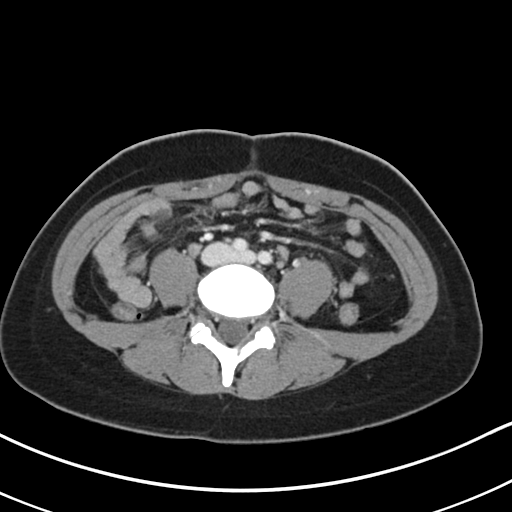
[im 48/85  soft-tissue]
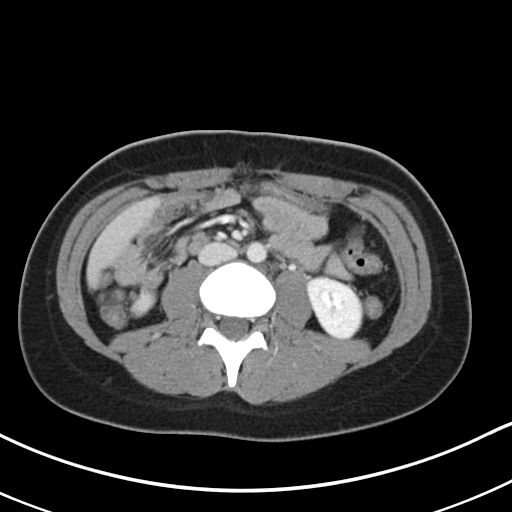
[im 54/85  soft-tissue]
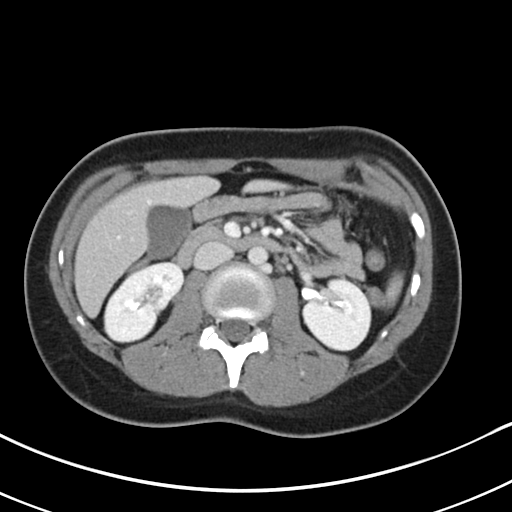
[im 61/85  soft-tissue]
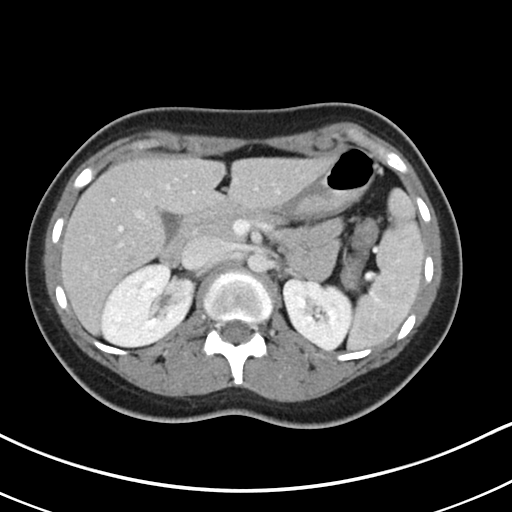
[im 61/85  bone]
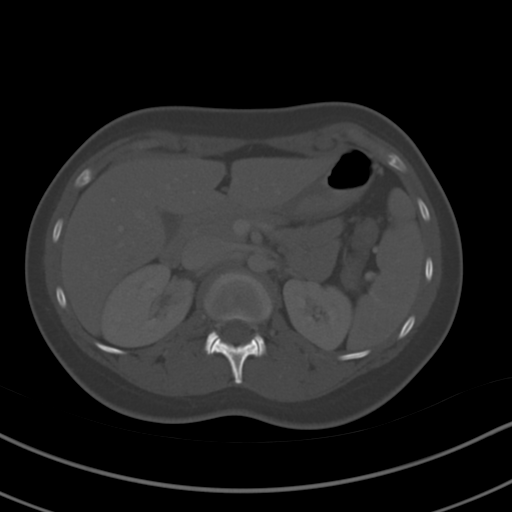
[im 68/85  soft-tissue]
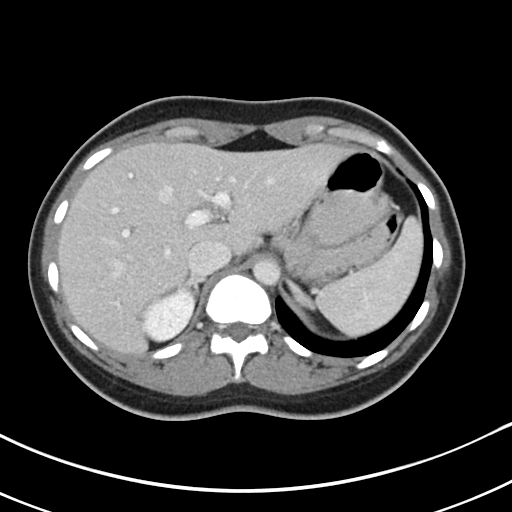
[im 74/85  soft-tissue]
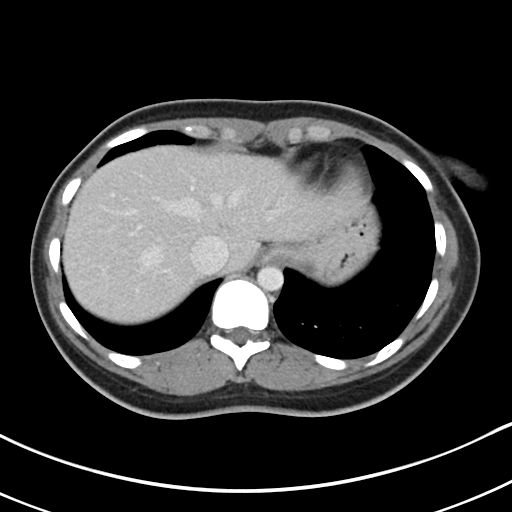
[im 81/85  soft-tissue]
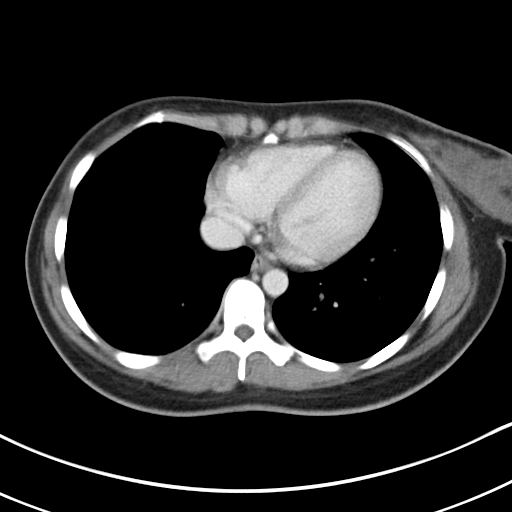

[Series 6: coronal soft tissue · coronal · 0.57mm/px · 3 of 66 slices shown]
[im 22/66  soft-tissue]
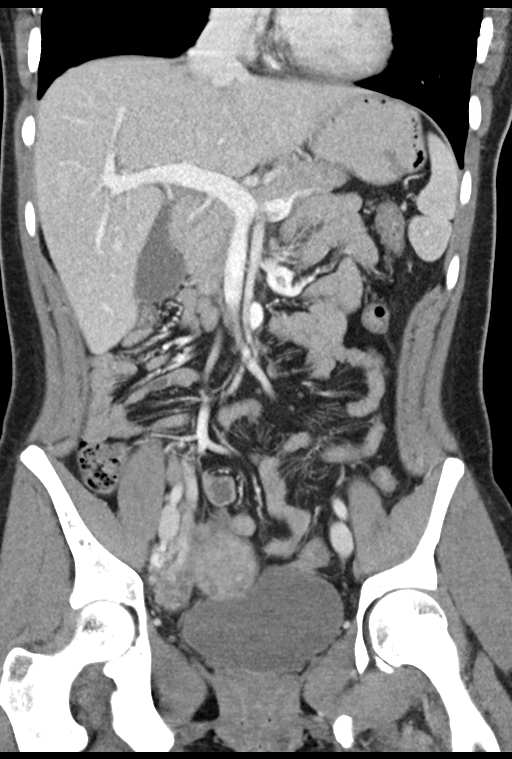
[im 29/66  soft-tissue]
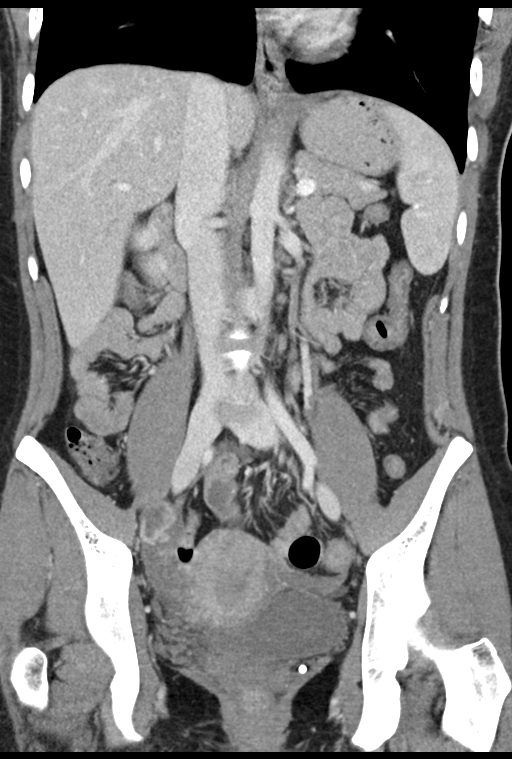
[im 37/66  soft-tissue]
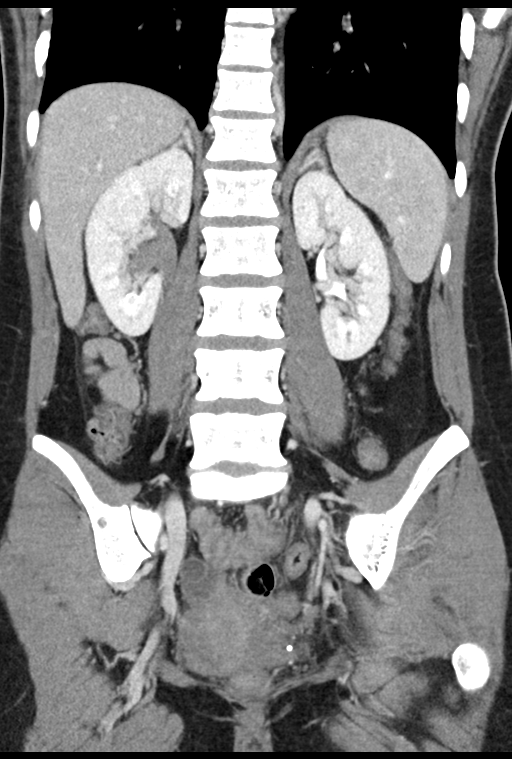

[15 of 46 positions shown; findings below may reference images not displayed]

FINDINGS: Lung bases are clear.

No focal liver lesions are identified. Gallbladder wall is not
thickened. There is no biliary duct dilatation.

Spleen, pancreas, and adrenals appear normal. Kidneys bilaterally
show no mass or hydronephrosis on either side. There is no renal or
ureteral calculus apparent on either side.

In the pelvis, the urinary bladder is midline with normal wall
thickness. There is evidence of a collapsed cyst in the right ovary
with enhancement of the wall of this cystic area. This cystic area
with enhancing wall measures 2.3 x 1.8 cm. There is a small amount
of fluid tracking from the right ovary into the cul-de-sac region.
No other pelvic mass is seen.

Appendix appears within normal limits.

There is no bowel obstruction.  No free air or portal venous air.

There is no adenopathy or abscess in the abdomen or pelvis. There is
no abdominal aortic aneurysm. There are no blastic or lytic bone
lesions. There is a minimal ventral hernia containing only fat.
IMPRESSION: Findings consistent with recent ovarian cyst rupture with fluid
tracking from the right ovary into the cul-de-sac. Nearby appendix
appears within normal limits. Appendix is actually best seen on
sagittal view on this study.

No bowel obstruction. No abscess. No free air. There is a minimal
ventral hernia containing only fat.

## 2016-01-10 NOTE — L&D Delivery Note (Signed)
Delivery Note Pt pushed well and after 10-48mins, at 11:08 PM, a viable female was delivered via Vaginal, Spontaneous Delivery (Presentation: ;  OA).  APGAR: 9,9, ; weight pending  .   Placenta status: delivered, intact, schultz , .  Cord: 3vc with the following complications:none  Anesthesia: epidural   Episiotomy: None Lacerations: None Suture Repair: n/a Est. Blood Loss (mL): 200  Mom to postpartum.  Baby to Couplet care / Skin to Skin.  Deborah Odom 10/12/2016, 11:17 PM

## 2016-04-25 ENCOUNTER — Ambulatory Visit (HOSPITAL_COMMUNITY)
Admission: EM | Admit: 2016-04-25 | Discharge: 2016-04-25 | Disposition: A | Discharge status: Home / Self Care | Payer: Medicaid Other | Attending: Internal Medicine | Admitting: Internal Medicine

## 2016-04-25 ENCOUNTER — Encounter (HOSPITAL_COMMUNITY): Payer: Self-pay | Admitting: Family Medicine

## 2016-04-25 DIAGNOSIS — J069 Acute upper respiratory infection, unspecified: Secondary | ICD-10-CM | POA: Diagnosis not present

## 2016-04-25 MED ORDER — ALBUTEROL SULFATE HFA 108 (90 BASE) MCG/ACT IN AERS: 2.0000 | INHALATION_SPRAY | RESPIRATORY_TRACT | 1 refills | Status: DC | PRN

## 2016-04-25 NOTE — ED Provider Notes (Signed)
CSN: [id number]     Arrival date & time 04/25/16  1224 History   None    Chief Complaint  Patient presents with  . URI   (Consider location/radiation/quality/duration/timing/severity/associated sxs/prior Treatment) The history is provided by the patient. No language interpreter was used.  URI  Presenting symptoms: congestion and cough   Severity:  Moderate Onset quality:  Gradual Duration:  2 days Timing:  Constant Progression:  Worsening Chronicity:  New Relieved by:  Nothing Worsened by:  Nothing Ineffective treatments:  None tried Risk factors: no sick contacts     Past Medical History:  Diagnosis Date  . Asthma    Past Surgical History:  Procedure Laterality Date  . NO PAST SURGERIES     Family History  Problem Relation Age of Onset  . Cancer Maternal Aunt   . Hypertension Maternal Grandmother   . Diabetes Maternal Grandmother    Social History  Substance Use Topics  . Smoking status: Never Smoker  . Smokeless tobacco: Never Used  . Alcohol use No   OB History    Gravida Para Term Preterm AB Living   SAB TAB Ectopic Multiple Live Births           1     Review of Systems  HENT: Positive for congestion.   Respiratory: Positive for cough.   All other systems reviewed and are negative.   Allergies  Shellfish allergy and Percocet [oxycodone-acetaminophen]  Home Medications   Prior to Admission medications   Medication Sig Start Date End Date Taking? Authorizing Provider  albuterol (PROVENTIL HFA;VENTOLIN HFA) 108 (90 Base) MCG/ACT inhaler Inhale 2 puffs into the lungs every 4 (four) hours as needed for wheezing or shortness of breath. 04/25/16   Elson Areas, PA-C   Meds Ordered and Administered this Visit  Medications - No data to display  BP (!) 101/53 (BP Location: Left Arm) Comment: notified rn  Pulse 94   Temp 98.7 F (37.1 C) (Oral)   Resp 14   SpO2 98%  No data found.   Physical Exam  Constitutional: She appears  well-developed and well-nourished. No distress.  HENT:  Head: Normocephalic and atraumatic.  Eyes: Conjunctivae are normal.  Neck: Neck supple.  Cardiovascular: Normal rate and regular rhythm.   No murmur heard. Pulmonary/Chest: Effort normal and breath sounds normal. No respiratory distress.  Abdominal: Soft. There is no tenderness.  Musculoskeletal: She exhibits no edema.  Neurological: She is alert.  Skin: Skin is warm and dry.  Psychiatric: She has a normal mood and affect.  Nursing note and vitals reviewed.   Urgent Care Course     Procedures (including critical care time)  Labs Review Labs Reviewed - No data to display  Imaging Review No results found.   Visual Acuity Review  Right Eye Distance:   Left Eye Distance:   Bilateral Distance:    Right Eye Near:   Left Eye Near:    Bilateral Near:         MDM  I suspect viral illness.  Pt has a history of asthma.  Pt does not have an inhaler.  Rx for albuterol inhaler, symptomatic care.   1. Upper respiratory tract infection, unspecified type    Meds ordered this encounter  Medications  . albuterol (PROVENTIL HFA;VENTOLIN HFA) 108 (90 Base) MCG/ACT inhaler    Sig: Inhale 2 puffs into the lungs every 4 (four) hours as needed for wheezing or shortness  of breath.    Dispense:  1 Inhaler    Refill:  1    Order Specific Question:   Supervising Provider    Answer:   Eustace Moore [[id number]]  An After Visit Summary was printed and given to the patient.     Lonia Skinner [place], PA-C 04/25/16 2029

## 2016-04-25 NOTE — ED Triage Notes (Signed)
Pt here for itchy throat, drainage in throat. sts she is pregnant. Denies any coughing, fever.

## 2016-04-27 LAB — OB RESULTS CONSOLE GC/CHLAMYDIA
Chlamydia: NEGATIVE
Gonorrhea: NEGATIVE

## 2016-05-02 LAB — OB RESULTS CONSOLE RUBELLA ANTIBODY, IGM: Rubella: IMMUNE

## 2016-07-18 LAB — OB RESULTS CONSOLE HIV ANTIBODY (ROUTINE TESTING): HIV: NONREACTIVE

## 2016-09-14 LAB — OB RESULTS CONSOLE GBS: GBS: NEGATIVE

## 2016-10-12 ENCOUNTER — Inpatient Hospital Stay (HOSPITAL_COMMUNITY): Payer: Medicaid Other | Admitting: Anesthesiology

## 2016-10-12 ENCOUNTER — Telehealth (HOSPITAL_COMMUNITY): Payer: Self-pay | Admitting: *Deleted

## 2016-10-12 ENCOUNTER — Inpatient Hospital Stay (HOSPITAL_COMMUNITY)
Admission: AD | Admit: 2016-10-12 | Discharge: 2016-10-14 | DRG: 807 | Disposition: A | Discharge status: Home / Self Care | Payer: Medicaid Other | Source: Ambulatory Visit | Attending: Obstetrics and Gynecology | Admitting: Obstetrics and Gynecology

## 2016-10-12 ENCOUNTER — Encounter (HOSPITAL_COMMUNITY): Payer: Self-pay

## 2016-10-12 DIAGNOSIS — O9952 Diseases of the respiratory system complicating childbirth: Secondary | ICD-10-CM | POA: Diagnosis present

## 2016-10-12 DIAGNOSIS — Z3A39 39 weeks gestation of pregnancy: Secondary | ICD-10-CM

## 2016-10-12 DIAGNOSIS — O2243 Hemorrhoids in pregnancy, third trimester: Secondary | ICD-10-CM | POA: Diagnosis present

## 2016-10-12 DIAGNOSIS — K219 Gastro-esophageal reflux disease without esophagitis: Secondary | ICD-10-CM | POA: Diagnosis present

## 2016-10-12 DIAGNOSIS — O9962 Diseases of the digestive system complicating childbirth: Secondary | ICD-10-CM | POA: Diagnosis present

## 2016-10-12 DIAGNOSIS — O26893 Other specified pregnancy related conditions, third trimester: Secondary | ICD-10-CM | POA: Diagnosis present

## 2016-10-12 DIAGNOSIS — J45909 Unspecified asthma, uncomplicated: Secondary | ICD-10-CM | POA: Diagnosis present

## 2016-10-12 LAB — TYPE AND SCREEN
ABO/RH(D): A POS
Antibody Screen: NEGATIVE

## 2016-10-12 LAB — CBC
HCT: 27.3 % — ABNORMAL LOW (ref 36.0–46.0)
Hemoglobin: 8.7 g/dL — ABNORMAL LOW (ref 12.0–15.0)
MCH: 25.6 pg — ABNORMAL LOW (ref 26.0–34.0)
MCHC: 31.9 g/dL (ref 30.0–36.0)
MCV: 80.3 fL (ref 78.0–100.0)
Platelets: 165 10*3/uL (ref 150–400)
RBC: 3.4 MIL/uL — ABNORMAL LOW (ref 3.87–5.11)
RDW: 15 % (ref 11.5–15.5)
WBC: 7.8 10*3/uL (ref 4.0–10.5)

## 2016-10-12 LAB — ABO/RH: ABO/RH(D): A POS

## 2016-10-12 MED ORDER — OXYTOCIN 40 UNITS IN LACTATED RINGERS INFUSION - SIMPLE MED
2.5000 [IU]/h | INTRAVENOUS | Status: DC
  Administered 2016-10-12: 2.5 [IU]/h via INTRAVENOUS
  Filled 2016-10-12: qty 1000

## 2016-10-12 MED ORDER — EPHEDRINE 5 MG/ML INJ: 10.0000 mg | INTRAVENOUS | Status: DC | PRN

## 2016-10-12 MED ORDER — ONDANSETRON HCL 4 MG/2ML IJ SOLN
4.0000 mg | Freq: Four times a day (QID) | INTRAMUSCULAR | Status: DC | PRN
  Administered 2016-10-12: 4 mg via INTRAVENOUS
  Filled 2016-10-12: qty 2

## 2016-10-12 MED ORDER — LIDOCAINE HCL (PF) 1 % IJ SOLN
INTRAMUSCULAR | Status: DC | PRN
  Administered 2016-10-12: 4 mL via EPIDURAL

## 2016-10-12 MED ORDER — BUTORPHANOL TARTRATE 1 MG/ML IJ SOLN
1.0000 mg | Freq: Once | INTRAMUSCULAR | Status: AC
  Administered 2016-10-12: 1 mg via INTRAVENOUS
  Filled 2016-10-12: qty 1

## 2016-10-12 MED ORDER — PHENYLEPHRINE 40 MCG/ML (10ML) SYRINGE FOR IV PUSH (FOR BLOOD PRESSURE SUPPORT): 80.0000 ug | PREFILLED_SYRINGE | INTRAVENOUS | Status: DC | PRN

## 2016-10-12 MED ORDER — SOD CITRATE-CITRIC ACID 500-334 MG/5ML PO SOLN: 30.0000 mL | ORAL | Status: DC | PRN

## 2016-10-12 MED ORDER — OXYTOCIN BOLUS FROM INFUSION
500.0000 mL | Freq: Once | INTRAVENOUS | Status: AC
  Administered 2016-10-12: 500 mL via INTRAVENOUS

## 2016-10-12 MED ORDER — DIPHENHYDRAMINE HCL 50 MG/ML IJ SOLN
12.5000 mg | INTRAMUSCULAR | Status: DC | PRN
  Administered 2016-10-12: 12.5 mg via INTRAVENOUS

## 2016-10-12 MED ORDER — LACTATED RINGERS IV SOLN: 500.0000 mL | Freq: Once | INTRAVENOUS | Status: DC

## 2016-10-12 MED ORDER — OXYCODONE-ACETAMINOPHEN 5-325 MG PO TABS: 1.0000 | ORAL_TABLET | ORAL | Status: DC | PRN

## 2016-10-12 MED ORDER — FENTANYL 2.5 MCG/ML BUPIVACAINE 1/10 % EPIDURAL INFUSION (WH - ANES)
14.0000 mL/h | INTRAMUSCULAR | Status: DC | PRN
  Administered 2016-10-12: 14 mL/h via EPIDURAL
  Filled 2016-10-12: qty 100

## 2016-10-12 MED ORDER — LACTATED RINGERS IV SOLN: 500.0000 mL | INTRAVENOUS | Status: DC | PRN

## 2016-10-12 MED ORDER — LACTATED RINGERS IV SOLN
INTRAVENOUS | Status: DC
  Administered 2016-10-12 (×2): via INTRAVENOUS

## 2016-10-12 MED ORDER — FLEET ENEMA 7-19 GM/118ML RE ENEM: 1.0000 | ENEMA | RECTAL | Status: DC | PRN

## 2016-10-12 MED ORDER — ACETAMINOPHEN 325 MG PO TABS: 650.0000 mg | ORAL_TABLET | ORAL | Status: DC | PRN

## 2016-10-12 MED ORDER — LIDOCAINE HCL (PF) 1 % IJ SOLN
30.0000 mL | INTRAMUSCULAR | Status: DC | PRN
  Filled 2016-10-12: qty 30

## 2016-10-12 MED ORDER — OXYCODONE-ACETAMINOPHEN 5-325 MG PO TABS: 2.0000 | ORAL_TABLET | ORAL | Status: DC | PRN

## 2016-10-12 MED ORDER — PHENYLEPHRINE 40 MCG/ML (10ML) SYRINGE FOR IV PUSH (FOR BLOOD PRESSURE SUPPORT)
80.0000 ug | PREFILLED_SYRINGE | INTRAVENOUS | Status: DC | PRN
  Filled 2016-10-12: qty 10

## 2016-10-12 NOTE — MAU Note (Signed)
+  contractions 5-6 min apart Rating pain 8.5/10  Denies LOF  Reports some bloody show  States last VE exam on Monday was 1.5cm

## 2016-10-12 NOTE — Anesthesia Preprocedure Evaluation (Signed)
Anesthesia Evaluation    Reviewed: Allergy & Precautions, Patient's Chart, lab work & pertinent test results  Airway Mallampati: II       Dental   Pulmonary asthma ,    Pulmonary exam normal        Cardiovascular negative cardio ROS Normal cardiovascular exam     Neuro/Psych negative neurological ROS     GI/Hepatic negative GI ROS, Neg liver ROS,   Endo/Other  negative endocrine ROS  Renal/GU negative Renal ROS     Musculoskeletal negative musculoskeletal ROS (+)   Abdominal   Peds  Hematology negative hematology ROS (+)   Anesthesia Other Findings Day of surgery medications reviewed with the patient.  Reproductive/Obstetrics (+) Pregnancy                             Lab Results  Component Value Date   WBC 8.2 04/10/2014   HGB 12.4 04/10/2014   HCT 36.4 04/10/2014   MCV 86.1 04/10/2014   PLT 193 04/10/2014   No results found for: INR, PROTIME    Anesthesia Physical Anesthesia Plan  ASA: II  Anesthesia Plan: Epidural   Post-op Pain Management:    Induction:   PONV Risk Score and Plan:   Airway Management Planned:   Additional Equipment:   Intra-op Plan:   Post-operative Plan:   Informed Consent: I have reviewed the patients History and Physical, chart, labs and discussed the procedure including the risks, benefits and alternatives for the proposed anesthesia with the patient or authorized representative who has indicated his/her understanding and acceptance.     Plan Discussed with:   Anesthesia Plan Comments:         Anesthesia Quick Evaluation

## 2016-10-12 NOTE — H&P (Signed)
Deborah Odom is a 31 y.o. female presenting for painful regular contractions. Pt progressed from 2cm to 5cm over the course of an hour in triage. Her prenatal care has only been complicated by GERD and hemorrhoids; has hx of asthma controlled with inhaler. She declined essential panel but had neg first trimester screen. GBS is neg.   OB History    Gravida Para Term Preterm AB Living   SAB TAB Ectopic Multiple Live Births           1     Past Medical History:  Diagnosis Date  . Asthma    Past Surgical History:  Procedure Laterality Date  . NO PAST SURGERIES     Family History: family history includes Cancer in her maternal aunt; Diabetes in her maternal grandmother; Hypertension in her maternal grandmother. Social History:  reports that she has never smoked. She has never used smokeless tobacco. She reports that she does not drink alcohol or use drugs.     Maternal Diabetes: No Genetic Screening: Normal Maternal Ultrasounds/Referrals: Normal Fetal Ultrasounds or other Referrals:  None Maternal Substance Abuse:  No Significant Maternal Medications:  Meds include: Other: inhaler Significant Maternal Lab Results:  Lab values include: Group B Strep negative Other Comments:  None  Review of Systems  Constitutional: Positive for malaise/fatigue. Negative for chills, fever and weight loss.  Eyes: Negative for blurred vision.  Respiratory: Negative for shortness of breath.   Cardiovascular: Negative for chest pain.  Gastrointestinal: Positive for abdominal pain. Negative for heartburn, nausea and vomiting.  Genitourinary: Negative for dysuria.  Musculoskeletal: Positive for back pain and myalgias.  Skin: Negative for itching and rash.  Neurological: Negative for dizziness and headaches.  Endo/Heme/Allergies: Does not bruise/bleed easily.  Psychiatric/Behavioral: Negative for depression, hallucinations, substance abuse and suicidal ideas. The patient is not  nervous/anxious.    Maternal Medical History:  Reason for admission: Nausea.    Dilation: 7 Effacement (%): 90 Station: -1 Exam by:: Dr. Mindi Slicker Blood pressure 109/67, pulse 79, temperature 98 F (36.7 C), temperature source Oral, resp. rate 16, height  (1.651 m), weight 202 lb (91.6 kg), SpO2 97 %, unknown if currently breastfeeding. Exam Physical Exam  Prenatal labs: ABO, Rh: --/--/A POS (10/04 1549) Antibody: NEG (10/04 1549) Rubella: @ RPR:    HBsAg:    HIV:    GBS:     Assessment/Plan: G2P1001 at 39 3/7wks in active labor Pt now comfortable with epidural AROM performed with moderate return of clear fluid SVE 7/90/-1 Anticipate svd Augment if indicated  Cathrine Muster 10/12/2016, 6:24 PM

## 2016-10-12 NOTE — Anesthesia Pain Management Evaluation Note (Signed)
  [facility] Pain Management Visit Note  Patient: Deborah Odom, 31 y.o., female  "Hello I am a member of the anesthesia team at [facility]. We have an anesthesia team available at all times to provide care throughout the hospital, including epidural management and anesthesia for C-section. I don't know your plan for the delivery whether it a natural birth, water birth, IV sedation, nitrous supplementation, doula or epidural, but we want to meet your pain goals."   1.Was your pain managed to your expectations on prior hospitalizations?   Yes   2.What is your expectation for pain management during this hospitalization?     Epidural  3.How can we help you reach that goal? epidural  Record the patient's initial score and the patient's pain goal.   Pain: 9/10  Pain Goal: 4/10 The [facility] wants you to be able to say your pain was always managed very well.  Salome Arnt 10/12/2016

## 2016-10-12 NOTE — Anesthesia Procedure Notes (Signed)
Epidural Patient location during procedure: OB Start time: 10/12/2016 4:04 PM End time: 10/12/2016 4:12 PM  Staffing Anesthesiologist: Shona Simpson D Performed: anesthesiologist   Preanesthetic Checklist Completed: patient identified, site marked, surgical consent, pre-op evaluation, timeout performed, IV checked, risks and benefits discussed and monitors and equipment checked  Epidural Patient position: sitting Prep: ChloraPrep Patient monitoring: heart rate, continuous pulse ox and blood pressure Approach: midline Location: L3-L4 Injection technique: LOR saline  Needle:  Needle type: Tuohy  Needle gauge: 17 G Needle length: 9 cm Catheter type: closed end flexible Catheter size: 20 Guage Test dose: negative and 1.5% lidocaine  Assessment Events: blood not aspirated, injection not painful, no injection resistance and no paresthesia  Additional Notes LOR @ 6  Patient identified. Risks/Benefits/Options discussed with patient including but not limited to bleeding, infection, nerve damage, paralysis, failed block, incomplete pain control, headache, blood pressure changes, nausea, vomiting, reactions to medications, itching and postpartum back pain. Confirmed with bedside nurse the patient's most recent platelet count. Confirmed with patient that they are not currently taking any anticoagulation, have any bleeding history or any family history of bleeding disorders. Patient expressed understanding and wished to proceed. All questions were answered. Sterile technique was used throughout the entire procedure. Please see nursing notes for vital signs. Test dose was given through epidural catheter and negative prior to continuing to dose epidural or start infusion. Warning signs of high block given to the patient including shortness of breath, tingling/numbness in hands, complete motor block, or any concerning symptoms with instructions to call for help. Patient was given instructions on fall  risk and not to get out of bed. All questions and concerns addressed with instructions to call with any issues or inadequate analgesia.    Reason for block:procedure for pain

## 2016-10-12 NOTE — Progress Notes (Signed)
Patient ID: Deborah Odom, female   DOB: 1985-07-27, 31 y.o.   MRN: [id number] Pt complete with urge to push Will commence pushing

## 2016-10-13 ENCOUNTER — Encounter (HOSPITAL_COMMUNITY): Payer: Self-pay

## 2016-10-13 LAB — CBC
HCT: 23.7 % — ABNORMAL LOW (ref 36.0–46.0)
Hemoglobin: 7.6 g/dL — ABNORMAL LOW (ref 12.0–15.0)
MCH: 26.3 pg (ref 26.0–34.0)
MCHC: 32.1 g/dL (ref 30.0–36.0)
MCV: 82 fL (ref 78.0–100.0)
Platelets: 156 10*3/uL (ref 150–400)
RBC: 2.89 MIL/uL — ABNORMAL LOW (ref 3.87–5.11)
RDW: 15.2 % (ref 11.5–15.5)
WBC: 10.4 10*3/uL (ref 4.0–10.5)

## 2016-10-13 LAB — RPR: RPR Ser Ql: NONREACTIVE

## 2016-10-13 LAB — BIRTH TISSUE RECOVERY COLLECTION (PLACENTA DONATION)

## 2016-10-13 MED ORDER — SENNOSIDES-DOCUSATE SODIUM 8.6-50 MG PO TABS
2.0000 | ORAL_TABLET | ORAL | Status: DC
  Administered 2016-10-13: 2 via ORAL
  Filled 2016-10-13: qty 2

## 2016-10-13 MED ORDER — BENZOCAINE-MENTHOL 20-0.5 % EX AERO
1.0000 "application " | INHALATION_SPRAY | CUTANEOUS | Status: DC | PRN
  Filled 2016-10-13: qty 56

## 2016-10-13 MED ORDER — TETANUS-DIPHTH-ACELL PERTUSSIS 5-2.5-18.5 LF-MCG/0.5 IM SUSP
0.5000 mL | Freq: Once | INTRAMUSCULAR | Status: AC
  Administered 2016-10-14: 0.5 mL via INTRAMUSCULAR
  Filled 2016-10-13: qty 0.5

## 2016-10-13 MED ORDER — ZOLPIDEM TARTRATE 5 MG PO TABS: 5.0000 mg | ORAL_TABLET | Freq: Every evening | ORAL | Status: DC | PRN

## 2016-10-13 MED ORDER — COCONUT OIL OIL
1.0000 "application " | TOPICAL_OIL | Status: DC | PRN
  Filled 2016-10-13: qty 120

## 2016-10-13 MED ORDER — ONDANSETRON HCL 4 MG PO TABS: 4.0000 mg | ORAL_TABLET | ORAL | Status: DC | PRN

## 2016-10-13 MED ORDER — DIBUCAINE 1 % RE OINT: 1.0000 "application " | TOPICAL_OINTMENT | RECTAL | Status: DC | PRN

## 2016-10-13 MED ORDER — PRENATAL MULTIVITAMIN CH
1.0000 | ORAL_TABLET | Freq: Every day | ORAL | Status: DC
  Administered 2016-10-13 – 2016-10-14 (×2): 1 via ORAL
  Filled 2016-10-13 (×2): qty 1

## 2016-10-13 MED ORDER — SIMETHICONE 80 MG PO CHEW: 80.0000 mg | CHEWABLE_TABLET | ORAL | Status: DC | PRN

## 2016-10-13 MED ORDER — WITCH HAZEL-GLYCERIN EX PADS: 1.0000 "application " | MEDICATED_PAD | CUTANEOUS | Status: DC | PRN

## 2016-10-13 MED ORDER — ACETAMINOPHEN 325 MG PO TABS: 650.0000 mg | ORAL_TABLET | ORAL | Status: DC | PRN

## 2016-10-13 MED ORDER — IBUPROFEN 600 MG PO TABS
600.0000 mg | ORAL_TABLET | Freq: Four times a day (QID) | ORAL | Status: DC
  Administered 2016-10-13 – 2016-10-14 (×7): 600 mg via ORAL
  Filled 2016-10-13 (×7): qty 1

## 2016-10-13 MED ORDER — INFLUENZA VAC SPLIT QUAD 0.5 ML IM SUSY
0.5000 mL | PREFILLED_SYRINGE | INTRAMUSCULAR | Status: DC
  Filled 2016-10-13: qty 0.5

## 2016-10-13 MED ORDER — ONDANSETRON HCL 4 MG/2ML IJ SOLN: 4.0000 mg | INTRAMUSCULAR | Status: DC | PRN

## 2016-10-13 MED ORDER — DIPHENHYDRAMINE HCL 25 MG PO CAPS: 25.0000 mg | ORAL_CAPSULE | Freq: Four times a day (QID) | ORAL | Status: DC | PRN

## 2016-10-13 NOTE — Progress Notes (Signed)
Post Partum Day 1 Subjective: no complaints, up ad lib and tolerating PO  Objective: Blood pressure (!) 104/56, pulse 86, temperature 97.7 F (36.5 C), resp. rate 14, height  (1.651 m), weight 91.6 kg (202 lb), SpO2 98 %, unknown if currently breastfeeding.  Physical Exam:  General: alert and cooperative Lochia: appropriate Uterine Fundus: firm    Recent Labs  10/12/16 1518  HGB 8.7*  HCT 27.3*    Assessment/Plan: Plan for discharge tomorrow   LOS: 1 day   Oliver Pila 10/13/2016, 10:23 AM

## 2016-10-13 NOTE — Anesthesia Postprocedure Evaluation (Signed)
Anesthesia Post Note  Patient: Deborah Odom  Procedure(s) Performed: AN AD HOC LABOR EPIDURAL     Patient location during evaluation: Mother Baby Anesthesia Type: Epidural Level of consciousness: awake Pain management: pain level controlled Vital Signs Assessment: post-procedure vital signs reviewed and stable Respiratory status: spontaneous breathing Cardiovascular status: stable Postop Assessment: no headache, no backache, epidural receding, patient able to bend at knees, no apparent nausea or vomiting and adequate PO intake Anesthetic complications: no    Last Vitals:  Vitals:   10/13/16 0524 10/13/16 0927  BP: (!) 103/49 (!) 104/56  Pulse: 85 86  Resp: 18 14  Temp: 37 C 36.5 C  SpO2:  98%    Last Pain:  Vitals:   10/13/16 1033  TempSrc:   PainSc: 0-No pain   Pain Goal: Patients Stated Pain Goal: 4 (10/12/16 1558)               Dalexa Gentz

## 2016-10-14 MED ORDER — IBUPROFEN 600 MG PO TABS: 600.0000 mg | ORAL_TABLET | Freq: Four times a day (QID) | ORAL | 0 refills | Status: DC

## 2016-10-14 NOTE — Lactation Note (Signed)
This note was copied from a baby's chart. Lactation Consultation Note  Patient Name: Deborah Odom Today's Date: 10/14/2016 Reason for consult: Initial assessment Baby is 53 hours old and seen by Lactation for Initial assessment. Baby was born at [redacted]w[redacted]d and weighed 7 lbs 5.1 oz at birth. Mom was about to BF baby when [facility] entered. Mom stated she BF her first daughter for ~3 months but used a lot of formula and wasn't as committed but this time she wants to BF longer and use less formula. While LC in room, mom BF baby in football hold on right breast; baby latched on first attempt and started suckling. Mom reported slight discomfort at the beginning of the latch but stated that it gets better after a few minutes. LC showed mom how to apply slight pressure to baby's chin to deepen the latch. Mom reported she felt a strong tug and no pinching. Mom reports her breasts feel heavier. Provided mom with BF booklet, BF Resources, and Feeding log; mom made aware of O/P services, breastfeeding support groups, community resources, and our phone # for post-discharge questions. Mom encouraged to feed baby 8-12 times/24 hours and with feeding cues. Discussed newborn behavior, engorgement prevention & treatment, BF on both breasts every feeding, and alternating which breast she starts with at each feeding. Mom reports she has a Medela Pump 'n Style pump at home but won't be returning to work until early next year. Mom reports no questions. Encouraged mom to ask for help as needed.    Maternal Data Does the patient have breastfeeding experience prior to this delivery?: Yes  Feeding Feeding Type: Breast Fed Length of feed:  (mom still BF when LC left room)  LATCH Score Latch: Grasps breast easily, tongue down, lips flanged, rhythmical sucking.  Audible Swallowing: A few with stimulation  Type of Nipple: Everted at rest and after stimulation  Comfort (Breast/Nipple): Soft / non-tender  Hold  (Positioning): Assistance needed to correctly position infant at breast and maintain latch.  LATCH Score: 8  Interventions Interventions: Breast feeding basics reviewed;Assisted with latch;Breast compression;Support pillows  Lactation Tools Discussed/Used     Consult Status Consult Status: Complete    Oneal Grout 10/14/2016, 1:10 PM

## 2016-10-14 NOTE — Discharge Summary (Signed)
OB Discharge Summary     Patient Name: Deborah Odom DOB: 1985/01/12 MRN: [id number]  Date of admission: 10/12/2016 Delivering MD: [facility]   Date of discharge: 10/14/2016  Admitting diagnosis: 39WKS CTX,  Intrauterine pregnancy: [redacted]w[redacted]d     Secondary diagnosis:  Active Problems:   Indication for care in labor or delivery   Postpartum care following vaginal delivery  Additional problems: none     Discharge diagnosis: Term Pregnancy Delivered                                                                                                Post partum procedures:none  Augmentation: AROM  Complications: None  Hospital course:  Onset of Labor With Vaginal Delivery     31 y.o. yo [id number] at [redacted]w[redacted]d was admitted in Active Labor on 10/12/2016. Patient had an uncomplicated labor course as follows:  Membrane Rupture Time/Date: 6:15 PM ,10/12/2016   Intrapartum Procedures: Episiotomy: None [1]                                         Lacerations:  None [1]  Patient had a delivery of a Viable infant. 10/12/2016  Information for the patient's newborn:  Cherrill, Scrima [[id number]]  Delivery Method: Vag-Spont    Pateint had an uncomplicated postpartum course.  She is ambulating, tolerating a regular diet, passing flatus, and urinating well. Patient is discharged home in stable condition on 10/14/16.   Physical exam  Vitals:   10/13/16 0927 10/13/16 1300 10/13/16 1737 10/14/16 0549  BP: (!) 104/56 100/68 (!) 112/56 (!) 106/55  Pulse: 86 89 82 77  Resp: Temp: 97.7 F (36.5 C) 98.2 F (36.8 C) 98.7 F (37.1 C) 98 F (36.7 C)  TempSrc:  Oral Oral Oral  SpO2: 98% 98%    Weight:      Height:       General: alert and cooperative Lochia: appropriate Uterine Fundus: firm  Labs: Lab Results  Component Value Date   WBC 10.4 10/13/2016   HGB 7.6 (L) 10/13/2016   HCT 23.7 (L) 10/13/2016   MCV 82.0 10/13/2016   PLT 156 10/13/2016   CMP Latest Ref Rng &  Units 04/10/2014  Glucose 70 - 99 mg/dL 130(Q)  BUN 6 - 23 mg/dL 8  Creatinine 6.57 - 8.46 mg/dL 9.62  Sodium 952 - 841 mmol/L 136  Potassium 3.5 - 5.1 mmol/L 3.4(L)  Chloride 96 - 112 mmol/L 107  CO2 19 - 32 mmol/L 20  Calcium 8.4 - 10.5 mg/dL 9.4  Total Protein 6.0 - 8.3 g/dL 7.2  Total Bilirubin 0.3 - 1.2 mg/dL 1.0  Alkaline Phos 39 - 117 U/L 50  AST 0 - 37 U/L 31  ALT 0 - 35 U/L 31    Discharge instruction: per After Visit Summary and "Baby and Me Booklet".  After visit meds:  Allergies as of 10/14/2016      Reactions   Shellfish Allergy Anaphylaxis   Percocet [  oxycodone-acetaminophen] Nausea And Vomiting      Medication List    TAKE these medications   acetaminophen 500 MG tablet Commonly known as:  TYLENOL Take 1,000 mg by mouth every 6 (six) hours as needed for moderate pain or headache.   albuterol 108 (90 Base) MCG/ACT inhaler Commonly known as:  PROVENTIL HFA;VENTOLIN HFA Inhale 2 puffs into the lungs every 4 (four) hours as needed for wheezing or shortness of breath.   dibucaine 1 % Oint Commonly known as:  NUPERCAINAL Place 1 application rectally as needed for hemorrhoids.   ibuprofen 600 MG tablet Commonly known as:  ADVIL,MOTRIN Take 1 tablet (600 mg total) by mouth every 6 (six) hours.   PROCTOSOL HC 2.5 % rectal cream Generic drug:  hydrocortisone APPLY TO AFFECTED AREA TWICE A DAY AS NEEDED       Diet: routine diet  Activity: Advance as tolerated. Pelvic rest for 6 weeks.   Outpatient follow up:6 weeks Follow up Appt:No future appointments. Follow up Visit:No Follow-up on file.  Postpartum contraception: Undecided  Newborn Data: Live born female  Birth Weight: 7 lb 5.1 oz (3320 g) APGAR: 8, 9  Newborn Delivery   Birth date/time:  10/12/2016 23:08:00 Delivery type:  Vaginal, Spontaneous Delivery      Baby Feeding: Breast Disposition:home with mother   10/14/2016 Oliver Pila, MD

## 2016-10-14 NOTE — Progress Notes (Signed)
Post Partum Day 2 Subjective: no complaints, up ad lib and tolerating PO  Objective: Blood pressure (!) 106/55, pulse 77, temperature 98 F (36.7 C), temperature source Oral, resp. rate 18, height  (1.651 m), weight 91.6 kg (202 lb), SpO2 98 %, unknown if currently breastfeeding.  Physical Exam:  General: alert and cooperative Lochia: appropriate Uterine Fundus: firm    Recent Labs  10/12/16 1518 10/13/16 1345  HGB 8.7* 7.6*  HCT 27.3* 23.7*    Assessment/Plan: Discharge home   LOS: 2 days   Deborah Odom 10/14/2016, 10:43 AM

## 2016-10-19 ENCOUNTER — Inpatient Hospital Stay (HOSPITAL_COMMUNITY): Admission: RE | Admit: 2016-10-19 | Payer: Medicaid Other | Source: Ambulatory Visit

## 2017-06-22 NOTE — Telephone Encounter (Signed)
Preadmission screen  

## 2020-02-09 ENCOUNTER — Encounter (HOSPITAL_COMMUNITY): Payer: Self-pay

## 2020-06-19 ENCOUNTER — Emergency Department (HOSPITAL_BASED_OUTPATIENT_CLINIC_OR_DEPARTMENT_OTHER): Payer: PRIVATE HEALTH INSURANCE

## 2020-06-19 ENCOUNTER — Encounter (HOSPITAL_BASED_OUTPATIENT_CLINIC_OR_DEPARTMENT_OTHER): Payer: Self-pay

## 2020-06-19 ENCOUNTER — Other Ambulatory Visit: Payer: Self-pay

## 2020-06-19 ENCOUNTER — Emergency Department (HOSPITAL_BASED_OUTPATIENT_CLINIC_OR_DEPARTMENT_OTHER)
Admission: EM | Admit: 2020-06-19 | Discharge: 2020-06-20 | Disposition: A | Discharge status: Home / Self Care | Payer: PRIVATE HEALTH INSURANCE | Attending: Emergency Medicine | Admitting: Emergency Medicine

## 2020-06-19 DIAGNOSIS — Z5321 Procedure and treatment not carried out due to patient leaving prior to being seen by health care provider: Secondary | ICD-10-CM | POA: Insufficient documentation

## 2020-06-19 DIAGNOSIS — U071 COVID-19: Secondary | ICD-10-CM | POA: Insufficient documentation

## 2020-06-19 DIAGNOSIS — R059 Cough, unspecified: Secondary | ICD-10-CM | POA: Diagnosis present

## 2020-06-19 HISTORY — DX: Anemia, unspecified: D64.9

## 2020-06-19 NOTE — ED Notes (Signed)
Called for patient for XRAY, no response.

## 2020-06-19 NOTE — ED Triage Notes (Addendum)
Pt tested positive for COVID on 06/17/20 and present to the ED for chest congestion and a productive cough with green phlegm since yesterday. Afebrile. Pt appears to be in NAD and RA 98%. Pt has been taking theraflu for sx.

## 2020-06-20 NOTE — ED Notes (Signed)
Pt called to room, No Answer

## 2020-06-20 NOTE — ED Notes (Signed)
Pt called for vital sign, no answer

## 2020-06-20 NOTE — ED Notes (Signed)
No answer times 3 when called by EMT

## 2023-04-18 DIAGNOSIS — H52223 Regular astigmatism, bilateral: Secondary | ICD-10-CM | POA: Diagnosis not present

## 2023-04-18 DIAGNOSIS — H5213 Myopia, bilateral: Secondary | ICD-10-CM | POA: Diagnosis not present

## 2023-08-16 DIAGNOSIS — N912 Amenorrhea, unspecified: Secondary | ICD-10-CM | POA: Diagnosis not present

## 2023-08-20 ENCOUNTER — Other Ambulatory Visit: Payer: Self-pay

## 2023-08-20 ENCOUNTER — Inpatient Hospital Stay (HOSPITAL_COMMUNITY)

## 2023-08-20 ENCOUNTER — Inpatient Hospital Stay (HOSPITAL_COMMUNITY)
Admission: AD | Admit: 2023-08-20 | Discharge: 2023-08-20 | Disposition: A | Discharge status: Home / Self Care | Attending: Obstetrics and Gynecology | Admitting: Obstetrics and Gynecology

## 2023-08-20 ENCOUNTER — Encounter (HOSPITAL_COMMUNITY): Payer: Self-pay | Admitting: Obstetrics and Gynecology

## 2023-08-20 DIAGNOSIS — O26891 Other specified pregnancy related conditions, first trimester: Secondary | ICD-10-CM | POA: Diagnosis not present

## 2023-08-20 DIAGNOSIS — O200 Threatened abortion: Secondary | ICD-10-CM | POA: Insufficient documentation

## 2023-08-20 DIAGNOSIS — O26851 Spotting complicating pregnancy, first trimester: Secondary | ICD-10-CM | POA: Diagnosis not present

## 2023-08-20 DIAGNOSIS — O209 Hemorrhage in early pregnancy, unspecified: Secondary | ICD-10-CM | POA: Diagnosis present

## 2023-08-20 DIAGNOSIS — O09511 Supervision of elderly primigravida, first trimester: Secondary | ICD-10-CM | POA: Diagnosis not present

## 2023-08-20 DIAGNOSIS — R1030 Lower abdominal pain, unspecified: Secondary | ICD-10-CM | POA: Diagnosis present

## 2023-08-20 DIAGNOSIS — Z3A01 Less than 8 weeks gestation of pregnancy: Secondary | ICD-10-CM | POA: Diagnosis not present

## 2023-08-20 NOTE — MAU Note (Signed)
 Deborah Odom is a 38 y.o. at Unknown here in MAU reporting: she's been having lower abdominal cramping and  intermittent sharp pain that began on Friday and brown spotting with wiping that started on Saturday.    LMP: 07/02/2023 Onset of complaint: Friday Pain score: 2 Vitals:   08/20/23 1237  BP: 133/75  Pulse: 74  Resp: 18  Temp: 98.7 F (37.1 C)  SpO2: 99%     FHT: NA  Lab orders placed from triage: None

## 2023-08-20 NOTE — MAU Provider Note (Addendum)
 History     Chief Complaint  Patient presents with   Abdominal Pain   Spotting  38 yo G4P3 BF LMP 6/23 (+) HCG 8./8 ( 2543)presents for evaluation of brown vaginal bleeding since Fri and intermittent abdominal pain since FRi. Last BM yesterday was normal. (+) urinary frequency w/o dysuria or urinary odor. Last coitus 8/1   OB History     Gravida  4   Para  3   Term  3   Preterm  0   AB  0   Living  3      SAB  0   IAB  0   Ectopic  0   Multiple      Live Births  3           Past Medical History:  Diagnosis Date   Anemia    Asthma     Past Surgical History:  Procedure Laterality Date   NO PAST SURGERIES      Family History  Problem Relation Age of Onset   Asthma Mother    Hypertension Mother    Cancer Maternal Aunt    Hypertension Maternal Grandmother    Diabetes Maternal Grandmother     Social History   Tobacco Use   Smoking status: Never   Smokeless tobacco: Never  Substance Use Topics   Alcohol use: No   Drug use: No    Allergies:  Allergies  Allergen Reactions   Shellfish Allergy Anaphylaxis    Medications Prior to Admission  Medication Sig Dispense Refill Last Dose/Taking   acetaminophen  (TYLENOL ) 500 MG tablet Take 1,000 mg by mouth every 6 (six) hours as needed for moderate pain or headache.   More than a month   albuterol  (PROVENTIL  HFA;VENTOLIN  HFA) 108 (90 Base) MCG/ACT inhaler Inhale 2 puffs into the lungs every 4 (four) hours as needed for wheezing or shortness of breath. 1 Inhaler 1    dibucaine (NUPERCAINAL) 1 % OINT Place 1 application rectally as needed for hemorrhoids.      ibuprofen  (ADVIL ,MOTRIN ) 600 MG tablet Take 1 tablet (600 mg total) by mouth every 6 (six) hours. 30 tablet 0    PROCTOSOL HC 2.5 % rectal cream APPLY TO AFFECTED AREA TWICE A DAY AS NEEDED  1      Physical Exam   Blood pressure 133/75, pulse 74, temperature 98.7 F (37.1 C), temperature source Oral, resp. rate 18, height 5' 5.5 (1.664 m),  weight 92.9 kg, last menstrual period 07/02/2023, SpO2 99%, unknown if currently breastfeeding.  General appearance: alert, cooperative, appears stated age, and mildly obese Lungs: clear to auscultation bilaterally Heart: regular rate and rhythm, S1, S2 normal, no murmur, click, rub or gallop Abdomen: soft, non-tender; bowel sounds normal; no masses,  no organomegaly Pelvic: external genitalia normal, no adnexal masses or tenderness, rectovaginal septum normal, and cervix parous closed. Vagina: dark brown blood. Uterus sl enlarged non tender, bladder non tender. Left bartholin's cyst Extremities: no edema, redness or tenderness in the calves or thighs  A positive by office lab ED Course  Vaginal bleeding in pregnancy Abdominal pain P) OB sonogram for dating/viability MDM   Dickie DELENA Carder, MD 1:14 PM 08/20/2023  US  OB Transvaginal Result Date: 08/20/2023 CLINICAL DATA:  Cramping, spotting EXAM: TRANSVAGINAL OB ULTRASOUND TECHNIQUE: Transvaginal ultrasound was performed for complete evaluation of the gestation as well as the maternal uterus, adnexal regions, and pelvic cul-de-sac. COMPARISON:  None Available. FINDINGS: Intrauterine gestational sac: Single Yolk sac:  Visualized. Embryo:  Visualized.  Cardiac Activity: Visualized. Heart Rate: 137 bpm MSD:   mm    w     d CRL:   10.3 mm   7 w 1 d                  US  EDC: 04/06/2024 Subchorionic hemorrhage:  None visualized. Maternal uterus/adnexae: No adnexal mass or significant free fluid. IMPRESSION: Seven week 1 day intrauterine pregnancy. Fetal heart rate 137 beats per minute. No acute maternal findings. Electronically Signed   By: Franky Crease M.D.   On: 08/20/2023 14:17    IMP: threatened abortion [facility]  P) d/c home. Threatened ab precautions reviewed via phone.  To come by the office to schedule appts

## 2023-08-28 DIAGNOSIS — O209 Hemorrhage in early pregnancy, unspecified: Secondary | ICD-10-CM | POA: Diagnosis not present

## 2023-08-28 DIAGNOSIS — O021 Missed abortion: Secondary | ICD-10-CM | POA: Diagnosis not present

## 2023-09-11 DIAGNOSIS — O0380 Unspecified complication following complete or unspecified spontaneous abortion: Secondary | ICD-10-CM | POA: Diagnosis not present

## 2023-11-22 DIAGNOSIS — Z01419 Encounter for gynecological examination (general) (routine) without abnormal findings: Secondary | ICD-10-CM | POA: Diagnosis not present

## 2023-11-22 DIAGNOSIS — Z130 Encounter for screening for diseases of the blood and blood-forming organs and certain disorders involving the immune mechanism: Secondary | ICD-10-CM | POA: Diagnosis not present

## 2023-11-22 DIAGNOSIS — Z1329 Encounter for screening for other suspected endocrine disorder: Secondary | ICD-10-CM | POA: Diagnosis not present

## 2023-11-22 DIAGNOSIS — Z0000 Encounter for general adult medical examination without abnormal findings: Secondary | ICD-10-CM | POA: Diagnosis not present

## 2023-11-22 DIAGNOSIS — Z124 Encounter for screening for malignant neoplasm of cervix: Secondary | ICD-10-CM | POA: Diagnosis not present

## 2023-11-22 DIAGNOSIS — Z13220 Encounter for screening for lipoid disorders: Secondary | ICD-10-CM | POA: Diagnosis not present

## 2023-11-22 DIAGNOSIS — Z131 Encounter for screening for diabetes mellitus: Secondary | ICD-10-CM | POA: Diagnosis not present
